# Patient Record
Sex: Male | Born: 1935
Health system: Southern US, Community
[De-identification: ages and names within clinical notes are randomized; demographics above are authoritative.]

## PROBLEM LIST (undated history)

## (undated) DIAGNOSIS — E785 Hyperlipidemia, unspecified: Secondary | ICD-10-CM

## (undated) DIAGNOSIS — C801 Malignant (primary) neoplasm, unspecified: Secondary | ICD-10-CM

## (undated) HISTORY — PX: SUPRAPUBIC CATHETER PLACEMENT: SHX2473

## (undated) HISTORY — PX: OTHER SURGICAL HISTORY: SHX169

## (undated) HISTORY — PX: CORONARY STENT PLACEMENT: SHX1402

## (undated) HISTORY — PX: CIRCUMCISION: SUR203

## (undated) HISTORY — PX: PACEMAKER IMPLANT: EP1218

---

## 2013-02-25 HISTORY — PX: COLOSTOMY: SHX63

## 2014-05-11 ENCOUNTER — Emergency Department (HOSPITAL_BASED_OUTPATIENT_CLINIC_OR_DEPARTMENT_OTHER)
Admission: EM | Admit: 2014-05-11 | Discharge: 2014-05-11 | Disposition: A | Discharge status: Home / Self Care | Payer: Medicare Other | Attending: Emergency Medicine | Admitting: Emergency Medicine

## 2014-05-11 ENCOUNTER — Encounter (HOSPITAL_BASED_OUTPATIENT_CLINIC_OR_DEPARTMENT_OTHER): Payer: Self-pay | Admitting: *Deleted

## 2014-05-11 ENCOUNTER — Emergency Department (HOSPITAL_BASED_OUTPATIENT_CLINIC_OR_DEPARTMENT_OTHER): Payer: Medicare Other

## 2014-05-11 DIAGNOSIS — Z906 Acquired absence of other parts of urinary tract: Secondary | ICD-10-CM | POA: Insufficient documentation

## 2014-05-11 DIAGNOSIS — J069 Acute upper respiratory infection, unspecified: Secondary | ICD-10-CM | POA: Insufficient documentation

## 2014-05-11 DIAGNOSIS — Z8739 Personal history of other diseases of the musculoskeletal system and connective tissue: Secondary | ICD-10-CM | POA: Insufficient documentation

## 2014-05-11 DIAGNOSIS — R05 Cough: Secondary | ICD-10-CM | POA: Diagnosis present

## 2014-05-11 DIAGNOSIS — Z8551 Personal history of malignant neoplasm of bladder: Secondary | ICD-10-CM | POA: Diagnosis not present

## 2014-05-11 HISTORY — DX: Malignant (primary) neoplasm, unspecified: C80.1

## 2014-05-11 HISTORY — DX: Hyperlipidemia, unspecified: E78.5

## 2014-05-11 MED ORDER — BENZONATATE 100 MG PO CAPS: 100.0000 mg | ORAL_CAPSULE | Freq: Three times a day (TID) | ORAL | Status: DC | PRN

## 2014-05-11 MED ORDER — FLUTICASONE PROPIONATE 50 MCG/ACT NA SUSP: 1.0000 | Freq: Every day | NASAL | Status: DC

## 2014-05-11 NOTE — Discharge Instructions (Signed)
Upper Respiratory Infection, Adult An upper respiratory infection (URI) is also sometimes known as the common cold. The upper respiratory tract includes the nose, sinuses, throat, trachea, and bronchi. Bronchi are the airways leading to the lungs. Most people improve within 1 week, but symptoms can last up to 2 weeks. A residual cough may last even longer.  CAUSES Many different viruses can infect the tissues lining the upper respiratory tract. The tissues become irritated and inflamed and often become very moist. Mucus production is also common. A cold is contagious. You can easily spread the virus to others by oral contact. This includes kissing, sharing a glass, coughing, or sneezing. Touching your mouth or nose and then touching a surface, which is then touched by another person, can also spread the virus. SYMPTOMS  Symptoms typically develop 1 to 3 days after you come in contact with a cold virus. Symptoms vary from person to person. They may include:  Runny nose.  Sneezing.  Nasal congestion.  Sinus irritation.  Sore throat.  Loss of voice (laryngitis).  Cough.  Fatigue.  Muscle aches.  Loss of appetite.  Headache.  Low-grade fever. DIAGNOSIS  You might diagnose your own cold based on familiar symptoms, since most people get a cold 2 to 3 times a year. Your caregiver can confirm this based on your exam. Most importantly, your caregiver can check that your symptoms are not due to another disease such as strep throat, sinusitis, pneumonia, asthma, or epiglottitis. Blood tests, throat tests, and X-rays are not necessary to diagnose a common cold, but they may sometimes be helpful in excluding other more serious diseases. Your caregiver will decide if any further tests are required. RISKS AND COMPLICATIONS  You may be at risk for a more severe case of the common cold if you smoke cigarettes, have chronic heart disease (such as heart failure) or lung disease (such as asthma), or if  you have a weakened immune system. The very young and very old are also at risk for more serious infections. Bacterial sinusitis, middle ear infections, and bacterial pneumonia can complicate the common cold. The common cold can worsen asthma and chronic obstructive pulmonary disease (COPD). Sometimes, these complications can require emergency medical care and may be life-threatening. PREVENTION  The best way to protect against getting a cold is to practice good hygiene. Avoid oral or hand contact with people with cold symptoms. Wash your hands often if contact occurs. There is no clear evidence that vitamin C, vitamin E, echinacea, or exercise reduces the chance of developing a cold. However, it is always recommended to get plenty of rest and practice good nutrition. TREATMENT  Treatment is directed at relieving symptoms. There is no cure. Antibiotics are not effective, because the infection is caused by a virus, not by bacteria. Treatment may include:  Increased fluid intake. Sports drinks offer valuable electrolytes, sugars, and fluids.  Breathing heated mist or steam (vaporizer or shower).  Eating chicken soup or other clear broths, and maintaining good nutrition.  Getting plenty of rest.  Using gargles or lozenges for comfort.  Controlling fevers with ibuprofen or acetaminophen as directed by your caregiver.  Increasing usage of your inhaler if you have asthma. Zinc gel and zinc lozenges, taken in the first 24 hours of the common cold, can shorten the duration and lessen the severity of symptoms. Pain medicines may help with fever, muscle aches, and throat pain. A variety of non-prescription medicines are available to treat congestion and runny nose. Your caregiver   can make recommendations and may suggest nasal or lung inhalers for other symptoms.  HOME CARE INSTRUCTIONS   Only take over-the-counter or prescription medicines for pain, discomfort, or fever as directed by your  caregiver.  Use a warm mist humidifier or inhale steam from a shower to increase air moisture. This may keep secretions moist and make it easier to breathe.  Drink enough water and fluids to keep your urine clear or pale yellow.  Rest as needed.  Return to work when your temperature has returned to normal or as your caregiver advises. You may need to stay home longer to avoid infecting others. You can also use a face mask and careful hand washing to prevent spread of the virus. SEEK MEDICAL CARE IF:   After the first few days, you feel you are getting worse rather than better.  You need your caregiver's advice about medicines to control symptoms.  You develop chills, worsening shortness of breath, or brown or red sputum. These may be signs of pneumonia.  You develop yellow or brown nasal discharge or pain in the face, especially when you bend forward. These may be signs of sinusitis.  You develop a fever, swollen neck glands, pain with swallowing, or white areas in the back of your throat. These may be signs of strep throat. SEEK IMMEDIATE MEDICAL CARE IF:   You have a fever.  You develop severe or persistent headache, ear pain, sinus pain, or chest pain.  You develop wheezing, a prolonged cough, cough up blood, or have a change in your usual mucus (if you have chronic lung disease).  You develop sore muscles or a stiff neck. Document Released: 08/07/2000 Document Revised: 05/06/2011 Document Reviewed: 05/19/2013 [employer] Patient Information 2015 [employer]. This information is not intended to replace advice given to you by your health care provider. Make sure you discuss any questions you have with your health care provider.  

## 2014-05-11 NOTE — ED Provider Notes (Signed)
TIME SEEN: 11:45 AM  CHIEF COMPLAINT: Cough, nasal congestion  HPI: Pt is a 80 y.o. male with history of hyperlipidemia, prior bladder cancer with cystectomy and current urostomy who presents emergency department with complaints of cough with white sputum production and nasal congestion for the past week. No fevers. No chest pain or shortness of breath. No nausea, vomiting or diarrhea. No sick contacts or recent travel.  ROS: See HPI Constitutional: no fever  Eyes: no drainage  ENT:  runny nose   Cardiovascular:  no chest pain  Resp: no SOB  GI: no vomiting GU: no dysuria Integumentary: no rash  Allergy: no hives  Musculoskeletal: no leg swelling  Neurological: no slurred speech ROS otherwise negative  PAST MEDICAL HISTORY/PAST SURGICAL HISTORY:  Past Medical History  Diagnosis Date  . Hyperlipidemia   . Cancer     MEDICATIONS:  Prior to Admission medications   Not on File    ALLERGIES:  Allergies  Allergen Reactions  . Asa [Aspirin] Other (See Comments)    Upset stomach  . Codeine Rash    SOCIAL HISTORY:  History  Substance Use Topics  . Smoking status: Never Smoker   . Smokeless tobacco: Not on file  . Alcohol Use: No    FAMILY HISTORY: No family history on file.  EXAM: BP 145/70 mmHg  Pulse 64  Temp(Src) 98.3 F (36.8 C) (Oral)  Resp 20  Ht 6' (1.829 m)  Wt 264 lb (119.75 kg)  BMI 35.80 kg/m2  SpO2 96% CONSTITUTIONAL: Alert and oriented and responds appropriately to questions. Well-appearing; well-nourished very well-appearing, nontoxic, pleasant, smiling HEAD: Normocephalic EYES: Conjunctivae clear, PERRL ENT: normal nose; no rhinorrhea; moist mucous membranes; pharynx without lesions noted NECK: Supple, no meningismus, no LAD  CARD: RRR; S1 and S2 appreciated; no murmurs, no clicks, no rubs, no gallops RESP: Normal chest excursion without splinting or tachypnea; breath sounds clear and equal bilaterally; no wheezes, no rhonchi, no rales, good  aeration diffusely, no hypoxia, no respiratory distress, speaking full sentences ABD/GI: Normal bowel sounds; non-distended; soft, non-tender, no rebound, no guarding BACK:  The back appears normal and is non-tender to palpation, there is no CVA tenderness EXT: Normal ROM in all joints; non-tender to palpation; no edema; normal capillary refill; no cyanosis; no calf tenderness or swelling    SKIN: Normal color for age and race; warm NEURO: Moves all extremities equally PSYCH: The patient's mood and manner are appropriate. Grooming and personal hygiene are appropriate.  MEDICAL DECISION MAKING: Patient here with cough and nasal congestion. Likely viral illness versus allergies. We'll discharge with Tessalon Perles and Flonase. Chest x-ray clear and shows no infiltrate, edema or effusion. He is hemodynamically stable with no hypoxia. Lungs are clear to auscultation with good aeration. Denies chest pain or shortness of breath. He is nontoxic appearing. I do not feel he needs to be discharged on antibiotics. Discussed return precautions. He verbalizes understanding and is comfortable with plan.       [place], DO 05/11/14 1310

## 2014-05-11 NOTE — ED Notes (Signed)
Pt seems much improved, states MD told him its probably  just allergy issues.  He states he's ready to go.

## 2014-05-11 NOTE — ED Notes (Signed)
Pt not sure he had the xray done yet.  He doesn't seem to remember. Reassured  And reoriented pt, asked if he had family waiting and he states he drove himself

## 2014-05-11 NOTE — ED Notes (Signed)
Pt reports understanding of D/c instructions, states will take medication as ordered

## 2014-05-11 NOTE — ED Notes (Signed)
Pt c/o cough and congestion x1 week. Pt reports productive cough of white mucous. Pt denies fever.

## 2014-10-04 ENCOUNTER — Emergency Department (HOSPITAL_BASED_OUTPATIENT_CLINIC_OR_DEPARTMENT_OTHER)
Admission: EM | Admit: 2014-10-04 | Discharge: 2014-10-04 | Disposition: A | Discharge status: Home / Self Care | Payer: Medicare Other | Attending: Physician Assistant | Admitting: Physician Assistant

## 2014-10-04 ENCOUNTER — Emergency Department (HOSPITAL_BASED_OUTPATIENT_CLINIC_OR_DEPARTMENT_OTHER): Payer: Medicare Other

## 2014-10-04 ENCOUNTER — Encounter (HOSPITAL_BASED_OUTPATIENT_CLINIC_OR_DEPARTMENT_OTHER): Payer: Self-pay | Admitting: *Deleted

## 2014-10-04 DIAGNOSIS — Z8551 Personal history of malignant neoplasm of bladder: Secondary | ICD-10-CM | POA: Diagnosis not present

## 2014-10-04 DIAGNOSIS — R109 Unspecified abdominal pain: Secondary | ICD-10-CM | POA: Diagnosis present

## 2014-10-04 DIAGNOSIS — Z8639 Personal history of other endocrine, nutritional and metabolic disease: Secondary | ICD-10-CM | POA: Diagnosis not present

## 2014-10-04 DIAGNOSIS — R531 Weakness: Secondary | ICD-10-CM | POA: Diagnosis not present

## 2014-10-04 LAB — URINALYSIS, ROUTINE W REFLEX MICROSCOPIC
BILIRUBIN URINE: NEGATIVE
GLUCOSE, UA: NEGATIVE mg/dL
Ketones, ur: NEGATIVE mg/dL
Nitrite: POSITIVE — AB
Specific Gravity, Urine: 1.015 (ref 1.005–1.030)
UROBILINOGEN UA: 0.2 mg/dL (ref 0.0–1.0)
pH: 7 (ref 5.0–8.0)

## 2014-10-04 LAB — URINE MICROSCOPIC-ADD ON

## 2014-10-04 LAB — CBC WITH DIFFERENTIAL/PLATELET
BASOS ABS: 0 10*3/uL (ref 0.0–0.1)
Basophils Relative: 0 % (ref 0–1)
Eosinophils Absolute: 0.1 10*3/uL (ref 0.0–0.7)
Eosinophils Relative: 1 % (ref 0–5)
HCT: 40.9 % (ref 39.0–52.0)
Hemoglobin: 13.1 g/dL (ref 13.0–17.0)
LYMPHS PCT: 10 % — AB (ref 12–46)
Lymphs Abs: 0.7 10*3/uL (ref 0.7–4.0)
MCH: 31.1 pg (ref 26.0–34.0)
MCHC: 32 g/dL (ref 30.0–36.0)
MCV: 97.1 fL (ref 78.0–100.0)
MONOS PCT: 8 % (ref 3–12)
Monocytes Absolute: 0.6 10*3/uL (ref 0.1–1.0)
Neutro Abs: 5.7 10*3/uL (ref 1.7–7.7)
Neutrophils Relative %: 81 % — ABNORMAL HIGH (ref 43–77)
Platelets: 149 10*3/uL — ABNORMAL LOW (ref 150–400)
RBC: 4.21 MIL/uL — ABNORMAL LOW (ref 4.22–5.81)
RDW: 12.5 % (ref 11.5–15.5)
WBC: 7 10*3/uL (ref 4.0–10.5)

## 2014-10-04 LAB — COMPREHENSIVE METABOLIC PANEL
ALT: 11 U/L — ABNORMAL LOW (ref 17–63)
AST: 29 U/L (ref 15–41)
Albumin: 4.3 g/dL (ref 3.5–5.0)
Alkaline Phosphatase: 66 U/L (ref 38–126)
Anion gap: 13 (ref 5–15)
BILIRUBIN TOTAL: 0.8 mg/dL (ref 0.3–1.2)
BUN: 23 mg/dL — ABNORMAL HIGH (ref 6–20)
CHLORIDE: 104 mmol/L (ref 101–111)
CO2: 24 mmol/L (ref 22–32)
Calcium: 8.9 mg/dL (ref 8.9–10.3)
Creatinine, Ser: 1.49 mg/dL — ABNORMAL HIGH (ref 0.61–1.24)
GFR, EST AFRICAN AMERICAN: 50 mL/min — AB (ref >60)
GFR, EST NON AFRICAN AMERICAN: 43 mL/min — AB (ref >60)
Glucose, Bld: 112 mg/dL — ABNORMAL HIGH (ref 65–99)
Potassium: 4.4 mmol/L (ref 3.5–5.1)
Sodium: 141 mmol/L (ref 135–145)
TOTAL PROTEIN: 7.8 g/dL (ref 6.5–8.1)

## 2014-10-04 LAB — TROPONIN I: Troponin I: <0.03 ng/mL (ref <0.031)

## 2014-10-04 LAB — LIPASE, BLOOD: Lipase: 13 U/L — ABNORMAL LOW (ref 22–51)

## 2014-10-04 MED ORDER — CEPHALEXIN 250 MG PO CAPS
500.0000 mg | ORAL_CAPSULE | Freq: Once | ORAL | Status: AC
  Administered 2014-10-04: 500 mg via ORAL
  Filled 2014-10-04: qty 2

## 2014-10-04 NOTE — ED Notes (Signed)
MD at bedside to discuss results of testing. 

## 2014-10-04 NOTE — ED Notes (Signed)
States he had country style steak yesterday and has had across the middle abd pain. No n/v/d. Feels like he has no energy. Last NL BM today.

## 2014-10-04 NOTE — ED Notes (Signed)
Urostomy emptied per pt request.

## 2014-10-04 NOTE — Discharge Instructions (Signed)
We talked to you are primary care provider they can see you tomorrow or the next day. Please call tomorrow morning.We cannot figure out why it is that you feel "unwell" but her are glad that you arer feeling better after taking a little rest here. Fatigue Fatigue is a feeling of tiredness, lack of energy, lack of motivation, or feeling tired all the time. Having enough rest, good nutrition, and reducing stress will normally reduce fatigue. Consult your caregiver if it persists. The nature of your fatigue will help your caregiver to find out its cause. The treatment is based on the cause.  CAUSES  There are many causes for fatigue. Most of the time, fatigue can be traced to one or more of your habits or routines. Most causes fit into one or more of three general areas. They are: Lifestyle problems  Sleep disturbances.  Overwork.  Physical exertion.  Unhealthy habits.  Poor eating habits or eating disorders.  Alcohol and/or drug use .  Lack of proper nutrition (malnutrition). Psychological problems  Stress and/or anxiety problems.  Depression.  Grief.  Boredom. Medical Problems or Conditions  Anemia.  Pregnancy.  Thyroid gland problems.  Recovery from major surgery.  Continuous pain.  Emphysema or asthma that is not well controlled  Allergic conditions.  Diabetes.  Infections (such as mononucleosis).  Obesity.  Sleep disorders, such as sleep apnea.  Heart failure or other heart-related problems.  Cancer.  Kidney disease.  Liver disease.  Effects of certain medicines such as antihistamines, cough and cold remedies, prescription pain medicines, heart and blood pressure medicines, drugs used for treatment of cancer, and some antidepressants. SYMPTOMS  The symptoms of fatigue include:   Lack of energy.  Lack of drive (motivation).  Drowsiness.  Feeling of indifference to the surroundings. DIAGNOSIS  The details of how you feel help guide your  caregiver in finding out what is causing the fatigue. You will be asked about your present and past health condition. It is important to review all medicines that you take, including prescription and non-prescription items. A thorough exam will be done. You will be questioned about your feelings, habits, and normal lifestyle. Your caregiver may suggest blood tests, urine tests, or other tests to look for common medical causes of fatigue.  TREATMENT  Fatigue is treated by correcting the underlying cause. For example, if you have continuous pain or depression, treating these causes will improve how you feel. Similarly, adjusting the dose of certain medicines will help in reducing fatigue.  HOME CARE INSTRUCTIONS   Try to get the required amount of good sleep every night.  Eat a healthy and nutritious diet, and drink enough water throughout the day.  Practice ways of relaxing (including yoga or meditation).  Exercise regularly.  Make plans to change situations that cause stress. Act on those plans so that stresses decrease over time. Keep your work and personal routine reasonable.  Avoid street drugs and minimize use of alcohol.  Start taking a daily multivitamin after consulting your caregiver. SEEK MEDICAL CARE IF:   You have persistent tiredness, which cannot be accounted for.  You have fever.  You have unintentional weight loss.  You have headaches.  You have disturbed sleep throughout the night.  You are feeling sad.  You have constipation.  You have dry skin.  You have gained weight.  You are taking any new or different medicines that you suspect are causing fatigue.  You are unable to sleep at night.  You develop any  unusual swelling of your legs or other parts of your body. SEEK IMMEDIATE MEDICAL CARE IF:   You are feeling confused.  Your vision is blurred.  You feel faint or pass out.  You develop severe headache.  You develop severe abdominal, pelvic, or  back pain.  You develop chest pain, shortness of breath, or an irregular or fast heartbeat.  You are unable to pass a normal amount of urine.  You develop abnormal bleeding such as bleeding from the rectum or you vomit blood.  You have thoughts about harming yourself or committing suicide.  You are worried that you might harm someone else. MAKE SURE YOU:   Understand these instructions.  Will watch your condition.  Will get help right away if you are not doing well or get worse. Document Released: 12/09/2006 Document Revised: 05/06/2011 Document Reviewed: 06/15/2013 [facility] Patient Information 2015 [place], [place]. This information is not intended to replace advice given to you by your health care provider. Make sure you discuss any questions you have with your health care provider.

## 2014-10-04 NOTE — ED Provider Notes (Addendum)
CSN: [id number]     Arrival date & time 10/04/14  27 History   First MD Initiated Contact with Patient 10/04/14 1107     No chief complaint on file.    (Consider location/radiation/quality/duration/timing/severity/associated sxs/prior Treatment) HPI   Patient is a 80 year old male with history of bladder cancer. Presenting today with vague symptoms of not feeling well. Patient reports that he ate lunch yesterday and had a chicken fried steak. After that he had mild abdominal pain. However no nausea no vomiting no diarrhea. He had a normal bowel movement this morning. Patient reports he took some Alka-Seltzer and Pepto-Bismol and his belly pain resolved. However he still just "doesn't feel right". No chest pain no real abdominal pain right now. No fevers  Past Medical History  Diagnosis Date  . Hyperlipidemia   . Cancer    Past Surgical History  Procedure Laterality Date  . Circumcision    . Bladder removed    . Suprapubic catheter placement     No family history on file. History  Substance Use Topics  . Smoking status: Never Smoker   . Smokeless tobacco: Not on file  . Alcohol Use: No    Review of Systems  Constitutional: Negative for fever and activity change.  HENT: Negative for drooling and hearing loss.   Eyes: Negative for discharge and redness.  Respiratory: Negative for cough and shortness of breath.   Cardiovascular: Negative for chest pain.  Gastrointestinal: Positive for abdominal pain.  Genitourinary: Negative for dysuria and urgency.  Musculoskeletal: Negative for arthralgias.  Allergic/Immunologic: Negative for immunocompromised state.  Neurological: Negative for seizures and speech difficulty.  Psychiatric/Behavioral: Negative for behavioral problems and agitation.  All other systems reviewed and are negative.     Allergies  Asa and Codeine  Home Medications   Prior to Admission medications   Not on File   BP 163/68 mmHg  Pulse 74  Temp(Src)  99.6 F (37.6 C) (Oral)  Resp 18  Ht 6' (1.829 m)  Wt 265 lb (120.203 kg)  BMI 35.93 kg/m2  SpO2 97% Physical Exam  Constitutional: He is oriented to person, place, and time. He appears well-nourished.  HENT:  Head: Normocephalic.  Mouth/Throat: Oropharynx is clear and moist.  Eyes: Conjunctivae are normal.  Neck: No tracheal deviation present.  Cardiovascular: Normal rate.   Pulmonary/Chest: Effort normal. No stridor. No respiratory distress.  Abdominal: Soft. There is no tenderness. There is no guarding.  absolutely no tenderness to exam.  Musculoskeletal: Normal range of motion. He exhibits no edema.  Neurological: He is oriented to person, place, and time. No cranial nerve deficit.  Skin: Skin is warm and dry. No rash noted. He is not diaphoretic.  Psychiatric: He has a normal mood and affect. His behavior is normal.  Nursing note and vitals reviewed.   ED Course  Procedures (including critical care time) Labs Review Labs Reviewed  LIPASE, BLOOD  TROPONIN I    Imaging Review No results found.   EKG Interpretation   Date/Time:  Tuesday October 04 2014 11:16:27 EDT Ventricular Rate:  63 PR Interval:  326 QRS Duration: 78 QT Interval:  402 QTC Calculation: 411 R Axis:   34 Text Interpretation:  Sinus rhythm with 1st degree A-V block Septal  infarct , age undetermined ST \\T \ T wave abnormality, consider  inferolateral ischemia Abnormal ECG T wave inversions inferior lateral.   Repeat tracings suggested Confirmed by Thomasene Lot, COURTNEY ([id number]) on  10/04/2014 11:27:34 AM      MDM  Final diagnoses:  None    Paitent is a 80 year old male with past ill history significant for for bladder cancer. He is presenting today with vague symptoms of "not feeling well".initially chief complaint was abdominal pain. However she states never really had abdominal pain. We will do screening labs and EKG and chest x-ray.   1:08 PM Patient had normal labs except for elevated  creatinine of 1.5. Given patient's vague nature, decided to call PCP. PCP reports that patient often comes in with vague symptoms. His T-wave inversions in the lateral leads are old. His creatinine is normal at 1.5 .  In other words all these labs and  EKG are at baseline.  Will PO challenge patient. With normal vital signs physical exam and labs no need for further observation. Hopefully be able to discharge   3:07 PM UA looks dirty, from ostomy. Talked to PCP, will hold off treatment at this time. Awaiting 3 hr trop and then plan to discharge.   Courteney Julio Alm, MD 10/04/14 Coffey, MD 10/04/14 [place], MD 10/04/14 1507

## 2014-10-07 LAB — URINE CULTURE: Culture: >=100000

## 2014-10-08 NOTE — Progress Notes (Signed)
ED Antimicrobial Stewardship Positive Culture Follow Up   Shawn Weaver is an 80 y.o. male who presented to [facility] on 10/04/2014 with a chief complaint of  Chief Complaint  Patient presents with  . Abdominal Pain    Recent Results (from the past 720 hour(s))  Urine culture     Status: None   Collection Time: 10/04/14  1:00 PM  Result Value Ref Range Status   Specimen Description URINE, RANDOM  Final   Special Requests NONE  Final   Culture   Final    >=100,000 COLONIES/mL KLEBSIELLA PNEUMONIAE >=100,000 COLONIES/mL KLEBSIELLA OZAENAE Performed at [facility]    Report Status 10/07/2014 FINAL  Final   Organism ID, Bacteria KLEBSIELLA PNEUMONIAE  Final   Organism ID, Bacteria KLEBSIELLA OZAENAE  Final      Susceptibility   Klebsiella pneumoniae - MIC*    AMPICILLIN 16 RESISTANT Resistant     CEFAZOLIN <=4 SENSITIVE Sensitive     CEFTRIAXONE <=1 SENSITIVE Sensitive     CIPROFLOXACIN <=0.25 SENSITIVE Sensitive     GENTAMICIN <=1 SENSITIVE Sensitive     IMIPENEM <=0.25 SENSITIVE Sensitive     NITROFURANTOIN 64 INTERMEDIATE Intermediate     TRIMETH/SULFA <=20 SENSITIVE Sensitive     AMPICILLIN/SULBACTAM 4 SENSITIVE Sensitive     PIP/TAZO <=4 SENSITIVE Sensitive     * >=100,000 COLONIES/mL KLEBSIELLA PNEUMONIAE   Klebsiella ozaenae - MIC*    AMPICILLIN >=32 RESISTANT Resistant     CEFAZOLIN <=4 SENSITIVE Sensitive     CEFTRIAXONE <=1 SENSITIVE Sensitive     CIPROFLOXACIN <=0.25 SENSITIVE Sensitive     GENTAMICIN <=1 SENSITIVE Sensitive     IMIPENEM <=0.25 SENSITIVE Sensitive     NITROFURANTOIN 64 INTERMEDIATE Intermediate     TRIMETH/SULFA <=20 SENSITIVE Sensitive     AMPICILLIN/SULBACTAM 4 SENSITIVE Sensitive     PIP/TAZO <=4 SENSITIVE Sensitive     * >=100,000 COLONIES/mL KLEBSIELLA OZAENAE    80 yo who presented with abd pain. Hx bladder CA. Pt has urostomy. UC grew out kleb. No urinary symptoms so will not treat.   ED Provider: Eugene Gavia, PA  Onnie Boer,  PharmD Pager: [phone] Infectious Diseases Pharmacist Phone# [phone]

## 2014-10-09 ENCOUNTER — Telehealth (HOSPITAL_COMMUNITY): Payer: Self-pay

## 2014-10-09 NOTE — Telephone Encounter (Signed)
Post ED Visit - Positive Culture Follow-up  Culture report reviewed by antimicrobial stewardship pharmacist: []  Wes Dulaney, Pharm.D., BCPS []  Heide Guile, Pharm.D., BCPS []  Alycia Rossetti, Pharm.D., BCPS [x]  McConnell, Pharm.D., BCPS, AAHIVP []  Legrand Como, Pharm.D., BCPS, AAHIVP []  Isac Sarna, Pharm.D., BCPS  Positive Urine culture, >/= 100,0000 colonies -> Klebsiella Ozaenae & Klebsiella Pneumoniae Chart reviewed by H. Patel-Mills PA "No treatment"  Dortha Kern 10/09/2014, 4:52 AM

## 2015-05-17 ENCOUNTER — Encounter (HOSPITAL_BASED_OUTPATIENT_CLINIC_OR_DEPARTMENT_OTHER): Payer: Self-pay | Admitting: Emergency Medicine

## 2015-05-17 ENCOUNTER — Emergency Department (HOSPITAL_BASED_OUTPATIENT_CLINIC_OR_DEPARTMENT_OTHER)
Admission: EM | Admit: 2015-05-17 | Discharge: 2015-05-18 | Disposition: A | Discharge status: Home / Self Care | Payer: Medicare Other | Attending: Emergency Medicine | Admitting: Emergency Medicine

## 2015-05-17 ENCOUNTER — Emergency Department (HOSPITAL_BASED_OUTPATIENT_CLINIC_OR_DEPARTMENT_OTHER): Payer: Medicare Other

## 2015-05-17 DIAGNOSIS — G4489 Other headache syndrome: Secondary | ICD-10-CM | POA: Diagnosis not present

## 2015-05-17 DIAGNOSIS — Z859 Personal history of malignant neoplasm, unspecified: Secondary | ICD-10-CM | POA: Diagnosis not present

## 2015-05-17 DIAGNOSIS — R51 Headache: Secondary | ICD-10-CM | POA: Diagnosis present

## 2015-05-17 DIAGNOSIS — Z8669 Personal history of other diseases of the nervous system and sense organs: Secondary | ICD-10-CM | POA: Insufficient documentation

## 2015-05-17 LAB — RAPID STREP SCREEN (MED CTR MEBANE ONLY): Streptococcus, Group A Screen (Direct): NEGATIVE

## 2015-05-17 MED ORDER — ACETAMINOPHEN 500 MG PO TABS
1000.0000 mg | ORAL_TABLET | Freq: Once | ORAL | Status: AC
  Administered 2015-05-17: 1000 mg via ORAL
  Filled 2015-05-17: qty 2

## 2015-05-17 MED ORDER — METOCLOPRAMIDE HCL 10 MG PO TABS
10.0000 mg | ORAL_TABLET | Freq: Three times a day (TID) | ORAL | Status: DC
  Administered 2015-05-17: 10 mg via ORAL
  Filled 2015-05-17: qty 1

## 2015-05-17 NOTE — ED Notes (Signed)
Patient transported to CT 

## 2015-05-17 NOTE — ED Provider Notes (Addendum)
CSN: [id number]     Arrival date & time 05/17/15  1920 History  By signing my name below, I, Stephania Fragmin, attest that this documentation has been prepared under the direction and in the presence of Cate Oravec, MD. Electronically Signed: Stephania Fragmin, ED Scribe. 05/17/2015. 11:50 PM.   Chief Complaint  Patient presents with  . Headache   Patient is a 81 y.o. male presenting with headaches. The history is provided by the patient. No language interpreter was used.  Headache Pain location:  Frontal Quality:  Unable to specify (aching) Radiates to:  Does not radiate Severity currently:  5/10 Onset quality:  Gradual Duration: "several days" Timing: waxing and waning but constant. Progression:  Improving Chronicity:  New Context: not activity   Relieved by:  Acetaminophen Worsened by:  Nothing Ineffective treatments:  None tried Associated symptoms: no back pain, no blurred vision, no congestion, no cough, no diarrhea, no dizziness, no fever, no focal weakness, no loss of balance, no neck pain, no neck stiffness, no numbness, no photophobia, no syncope, no vomiting and no weakness   Risk factors: no anger    HPI Comments: Shawn Weaver is a 81 y.o. male with a history of hyperlipidemia and cancer, who presents to the Emergency Department complaining of a gradual-onset, intermittent, improving generalized frontal headache that began several days ago. He states it has improved since arriving to the ED. Patient states he has taken Tylenol, last taken 1.5 days ago, with mild relief. He denies nasal congestion, cough, rhinorrhea, emesis, diarrhea, or dysuria.   PCP: [name], MD   Past Medical History  Diagnosis Date  . Hyperlipidemia   . Cancer [facility])    Past Surgical History  Procedure Laterality Date  . Circumcision    . Bladder removed    . Suprapubic catheter placement    . Colostomy  2015   No family history on file. Social History  Substance Use Topics  . Smoking status:  Never Smoker   . Smokeless tobacco: None  . Alcohol Use: No    Review of Systems  Constitutional: Negative for fever.  HENT: Negative for congestion and rhinorrhea.   Eyes: Negative for blurred vision and photophobia.  Respiratory: Negative for cough.   Cardiovascular: Negative for syncope.  Gastrointestinal: Negative for vomiting and diarrhea.  Genitourinary: Negative for dysuria.  Musculoskeletal: Negative for back pain, neck pain and neck stiffness.  Neurological: Positive for headaches. Negative for dizziness, focal weakness, facial asymmetry, speech difficulty, weakness, numbness and loss of balance.  All other systems reviewed and are negative.  Allergies  Asa and Codeine  Home Medications   Prior to Admission medications   Not on File   BP 107/89 mmHg  Pulse 95  Temp(Src) 100.2 F (37.9 C) (Oral)  Resp 18  Ht 6' (1.829 m)  Wt 268 lb (121.564 kg)  BMI 36.34 kg/m2  SpO2 100% Physical Exam  Constitutional: He is oriented to person, place, and time. He appears well-developed and well-nourished. No distress.  Well-appearing.  HENT:  Head: Normocephalic and atraumatic.  Mouth/Throat: Oropharynx is clear and moist. No oropharyngeal exudate.  Eyes: Conjunctivae and EOM are normal. Pupils are equal, round, and reactive to light.  Neck: Normal range of motion. Neck supple. No tracheal deviation present. No Brudzinski's sign and no Kernig's sign noted.  No signs of meningismus.   Cardiovascular: Normal rate, regular rhythm, normal heart sounds and intact distal pulses.   Pulmonary/Chest: Effort normal and breath sounds normal. No respiratory distress. He  has no wheezes. He has no rales.  Lungs are clear to auscultation.   Abdominal: Soft. Bowel sounds are increased. There is no tenderness. There is no rebound and no guarding.  Hyperactive bowel sounds.  Musculoskeletal: Normal range of motion.  Lymphadenopathy:    He has no cervical adenopathy.  Neurological: He is  alert and oriented to person, place, and time. He has normal reflexes. No cranial nerve deficit.  Cranial nerves II-XII are intact. Normal upper and lower DTR's.   Skin: Skin is warm and dry.  Psychiatric: He has a normal mood and affect. His behavior is normal.  Nursing note and vitals reviewed.   ED Course  Procedures (including critical care time)  DIAGNOSTIC STUDIES: Oxygen Saturation is 100% on RA, normal by my interpretation.    COORDINATION OF CARE: 11:12 PM - Discussed treatment plan with pt at bedside. Pt verbalized understanding and agreed to plan.   Labs Review Labs Reviewed - No data to display  Imaging Review No results found. I have personally reviewed and evaluated these images and lab results as part of my medical decision-making.   EKG Interpretation None      MDM   Final diagnoses:  None   Medications  metoCLOPramide (REGLAN) tablet 10 mg (10 mg Oral Given 05/17/15 2326)  acetaminophen (TYLENOL) tablet 1,000 mg (1,000 mg Oral Given 05/17/15 2326)  naproxen (NAPROSYN) tablet 500 mg (500 mg Oral Given 05/18/15 0047)    Results for orders placed or performed during the hospital encounter of 05/17/15  Rapid strep screen  Result Value Ref Range   Streptococcus, Group A Screen (Direct) NEGATIVE NEGATIVE   Ct Head Wo Contrast  05/18/2015  CLINICAL DATA:  Bi frontal headaches for 1-1/2 days. No head injury. EXAM: CT HEAD WITHOUT CONTRAST TECHNIQUE: Contiguous axial images were obtained from the base of the skull through the vertex without intravenous contrast. COMPARISON:  None. FINDINGS: Mild diffuse cerebral atrophy. Mild ventricular dilatation consistent with central atrophy. Patchy low-attenuation changes in the deep white matter consistent with small vessel ischemia. No mass effect or midline shift. No abnormal extra-axial fluid collections. Gray-white matter junctions are distinct. Basal cisterns are not effaced. No evidence of acute intracranial hemorrhage.  No depressed skull fractures. Visualized paranasal sinuses and mastoid air cells are not opacified. Vascular calcifications. Small subcutaneous scalp hematoma over the left anterior frontal region. IMPRESSION: No acute intracranial abnormalities. Mild chronic atrophy and small vessel ischemic changes. Electronically Signed   By: Lucienne Capers M.D.   On: 05/18/2015 00:03  Well appearing, exam is benign and reassuring there are no signs of meningitis.  No indication for LP at this time.    Feeling better post medication.  Follow up with your PMD for recheck in 2 days.  Strict return precautions given for fever stiff neck or any concerns.  Patient verbalizes understanding and agrees to follow up  I personally performed the services described in this documentation, which was scribed in my presence. The recorded information has been reviewed and is accurate.       Veatrice Kells, MD 05/18/15 0131  Raylene Carmickle, MD 05/18/15 [id number]

## 2015-05-17 NOTE — ED Notes (Signed)
Pt c/o persistent headache for a few days also with upset stomach. denies LOC. Alert/Oriented at triage. Took Pepto-bismal at home.

## 2015-05-18 MED ORDER — NAPROXEN 250 MG PO TABS
500.0000 mg | ORAL_TABLET | Freq: Once | ORAL | Status: AC
  Administered 2015-05-18: 500 mg via ORAL
  Filled 2015-05-18: qty 2

## 2015-05-18 MED ORDER — IBUPROFEN 800 MG PO TABS: 800.0000 mg | ORAL_TABLET | Freq: Three times a day (TID) | ORAL | Status: DC

## 2015-05-18 NOTE — Discharge Instructions (Signed)

## 2015-05-18 NOTE — ED Notes (Signed)
Pt verbalizes understanding of d/c instructions and denies any further needs at this time. 

## 2015-05-20 LAB — CULTURE, GROUP A STREP (THRC)

## 2015-09-15 ENCOUNTER — Inpatient Hospital Stay (HOSPITAL_BASED_OUTPATIENT_CLINIC_OR_DEPARTMENT_OTHER)
Admission: EM | Admit: 2015-09-15 | Discharge: 2015-09-18 | DRG: 871 | Disposition: A | Discharge status: Home / Self Care | Payer: Medicare Other | Attending: Internal Medicine | Admitting: Internal Medicine

## 2015-09-15 ENCOUNTER — Encounter (HOSPITAL_BASED_OUTPATIENT_CLINIC_OR_DEPARTMENT_OTHER): Payer: Self-pay | Admitting: Emergency Medicine

## 2015-09-15 ENCOUNTER — Emergency Department (HOSPITAL_BASED_OUTPATIENT_CLINIC_OR_DEPARTMENT_OTHER): Payer: Medicare Other

## 2015-09-15 DIAGNOSIS — N183 Chronic kidney disease, stage 3 unspecified: Secondary | ICD-10-CM | POA: Diagnosis present

## 2015-09-15 DIAGNOSIS — R531 Weakness: Secondary | ICD-10-CM

## 2015-09-15 DIAGNOSIS — Z8551 Personal history of malignant neoplasm of bladder: Secondary | ICD-10-CM

## 2015-09-15 DIAGNOSIS — G4730 Sleep apnea, unspecified: Secondary | ICD-10-CM | POA: Diagnosis present

## 2015-09-15 DIAGNOSIS — I129 Hypertensive chronic kidney disease with stage 1 through stage 4 chronic kidney disease, or unspecified chronic kidney disease: Secondary | ICD-10-CM | POA: Diagnosis present

## 2015-09-15 DIAGNOSIS — F329 Major depressive disorder, single episode, unspecified: Secondary | ICD-10-CM | POA: Diagnosis present

## 2015-09-15 DIAGNOSIS — D649 Anemia, unspecified: Secondary | ICD-10-CM | POA: Diagnosis present

## 2015-09-15 DIAGNOSIS — B9620 Unspecified Escherichia coli [E. coli] as the cause of diseases classified elsewhere: Secondary | ICD-10-CM | POA: Diagnosis present

## 2015-09-15 DIAGNOSIS — R5383 Other fatigue: Secondary | ICD-10-CM | POA: Diagnosis present

## 2015-09-15 DIAGNOSIS — D638 Anemia in other chronic diseases classified elsewhere: Secondary | ICD-10-CM | POA: Diagnosis present

## 2015-09-15 DIAGNOSIS — N390 Urinary tract infection, site not specified: Secondary | ICD-10-CM | POA: Diagnosis present

## 2015-09-15 DIAGNOSIS — J189 Pneumonia, unspecified organism: Secondary | ICD-10-CM | POA: Diagnosis present

## 2015-09-15 DIAGNOSIS — R7989 Other specified abnormal findings of blood chemistry: Secondary | ICD-10-CM | POA: Diagnosis not present

## 2015-09-15 DIAGNOSIS — N179 Acute kidney failure, unspecified: Secondary | ICD-10-CM | POA: Diagnosis present

## 2015-09-15 DIAGNOSIS — R4182 Altered mental status, unspecified: Secondary | ICD-10-CM | POA: Diagnosis present

## 2015-09-15 DIAGNOSIS — F32A Depression, unspecified: Secondary | ICD-10-CM | POA: Diagnosis present

## 2015-09-15 DIAGNOSIS — E860 Dehydration: Secondary | ICD-10-CM | POA: Diagnosis present

## 2015-09-15 DIAGNOSIS — Z9114 Patient's other noncompliance with medication regimen: Secondary | ICD-10-CM

## 2015-09-15 DIAGNOSIS — A4151 Sepsis due to Escherichia coli [E. coli]: Principal | ICD-10-CM | POA: Diagnosis present

## 2015-09-15 DIAGNOSIS — I440 Atrioventricular block, first degree: Secondary | ICD-10-CM | POA: Diagnosis present

## 2015-09-15 DIAGNOSIS — E785 Hyperlipidemia, unspecified: Secondary | ICD-10-CM | POA: Diagnosis present

## 2015-09-15 DIAGNOSIS — M109 Gout, unspecified: Secondary | ICD-10-CM | POA: Diagnosis present

## 2015-09-15 DIAGNOSIS — Z7951 Long term (current) use of inhaled steroids: Secondary | ICD-10-CM

## 2015-09-15 DIAGNOSIS — E871 Hypo-osmolality and hyponatremia: Secondary | ICD-10-CM | POA: Diagnosis present

## 2015-09-15 LAB — CBC
HCT: 35.2 % — ABNORMAL LOW (ref 39.0–52.0)
Hemoglobin: 11.4 g/dL — ABNORMAL LOW (ref 13.0–17.0)
MCH: 30.3 pg (ref 26.0–34.0)
MCHC: 32.4 g/dL (ref 30.0–36.0)
MCV: 93.6 fL (ref 78.0–100.0)
Platelets: 210 K/uL (ref 150–400)
RBC: 3.76 MIL/uL — ABNORMAL LOW (ref 4.22–5.81)
RDW: 13.5 % (ref 11.5–15.5)
WBC: 8.3 K/uL (ref 4.0–10.5)

## 2015-09-15 LAB — COMPREHENSIVE METABOLIC PANEL WITH GFR
ALT: 12 U/L — ABNORMAL LOW (ref 17–63)
AST: 20 U/L (ref 15–41)
Albumin: 3.8 g/dL (ref 3.5–5.0)
Alkaline Phosphatase: 66 U/L (ref 38–126)
Anion gap: 11 (ref 5–15)
BUN: 38 mg/dL — ABNORMAL HIGH (ref 6–20)
CO2: 23 mmol/L (ref 22–32)
Calcium: 8.7 mg/dL — ABNORMAL LOW (ref 8.9–10.3)
Chloride: 97 mmol/L — ABNORMAL LOW (ref 101–111)
Creatinine, Ser: 2.31 mg/dL — ABNORMAL HIGH (ref 0.61–1.24)
GFR calc Af Amer: 29 mL/min — ABNORMAL LOW
GFR calc non Af Amer: 25 mL/min — ABNORMAL LOW
Glucose, Bld: 124 mg/dL — ABNORMAL HIGH (ref 65–99)
Potassium: 4.2 mmol/L (ref 3.5–5.1)
Sodium: 131 mmol/L — ABNORMAL LOW (ref 135–145)
Total Bilirubin: 1.4 mg/dL — ABNORMAL HIGH (ref 0.3–1.2)
Total Protein: 8.1 g/dL (ref 6.5–8.1)

## 2015-09-15 LAB — I-STAT CG4 LACTIC ACID, ED: LACTIC ACID, VENOUS: 1.61 mmol/L (ref 0.5–1.9)

## 2015-09-15 MED ORDER — ACETAMINOPHEN 325 MG PO TABS
650.0000 mg | ORAL_TABLET | Freq: Once | ORAL | Status: AC | PRN
  Administered 2015-09-15: 650 mg via ORAL
  Filled 2015-09-15: qty 2

## 2015-09-15 MED ORDER — AZITHROMYCIN 500 MG IV SOLR
500.0000 mg | Freq: Once | INTRAVENOUS | Status: AC
  Administered 2015-09-15: 500 mg via INTRAVENOUS

## 2015-09-15 MED ORDER — DEXTROSE 5 % IV SOLN
1.0000 g | Freq: Once | INTRAVENOUS | Status: AC
  Administered 2015-09-15: 1 g via INTRAVENOUS
  Filled 2015-09-15: qty 10

## 2015-09-15 MED ORDER — SODIUM CHLORIDE 0.9 % IV BOLUS (SEPSIS)
1000.0000 mL | Freq: Once | INTRAVENOUS | Status: AC
  Administered 2015-09-15: 1000 mL via INTRAVENOUS

## 2015-09-15 MED ORDER — AZITHROMYCIN 500 MG IV SOLR
INTRAVENOUS | Status: AC
  Filled 2015-09-15: qty 500

## 2015-09-15 NOTE — ED Notes (Signed)
Pt's wife is leaving to go home, pt's wife said to call her when the patient is being admitted and to where-  instructed  Pt's wife that we are unaware of wither the patient will be discharged or sent to another hospital. Pt's wife verbalized understanding and understood the patient would need to be picked up if discharged.  Obtained pt's wife home phone number [phone].

## 2015-09-15 NOTE — ED Notes (Addendum)
Patient states that he "just doesn't feel good"  - reports that he is having Headaches. And is not taking his medications per his wife. The patient reports that he has been dizzy, - patient seems confused to his wife.  Patients wife states that "this has been going on a long time"  Patient today though is more confused, for instance is unable to rate his pain, unable to answer questions appropriately and is dressed inappropriately ( shirt is on backwards, and wife had to fix him wearing 2 shirts)

## 2015-09-15 NOTE — Progress Notes (Signed)
Transfer from [facility] 81 y.o. male with PMHx of HLD, HTN, sleep apnea, and bladder cancer s/p suprapubic catheter; who presents to the lungs of gradual onset, constant, generalized fatigue/weakness, and cough. Temperature up to 102.56F, all other VS wnl. WBC 8.3, hbg 11.4, lactic acid 1.6, creatinine 2.31, BUN 38.  CXR shows LLL pna. UA should be collected prior to transport. Given 1 L of IV fluids, started on ceftriaxone and azithromycin for presumed CAP. Asked to check EKG prior to transport. Transferring to a MedSurg bed unless abnormal EKG.

## 2015-09-15 NOTE — ED Notes (Signed)
Lactic acid result delivered to Dr Tamera Punt.

## 2015-09-15 NOTE — ED Notes (Signed)
Patient transported to X-ray 

## 2015-09-15 NOTE — ED Provider Notes (Signed)
CSN: [id number]     Arrival date & time 09/15/15  1951 History  By signing my name below, I, Shawn Weaver, attest that this documentation has been prepared under the direction and in the presence of non-physician practitioner, Eliezer Mccoy, PA-C. Electronically Signed: [name], Scribe. 09/15/2015. 9:13 PM.   Chief Complaint  Patient presents with  . Altered Mental Status    The history is provided by the patient and the spouse. No language interpreter was used.     HPI Comments: Shawn Weaver is a 81 y.o. male with PMHx of HLD, HTN, sleep apnea, and bladder cancer who presents to the Emergency Department complaining of gradual onset, constant, generalized fatigue and weakness ongoing for several months. Pt states he has "no energy." Pt reports that he has seen his PCP for these symptoms; the last time he saw his PCP was several weeks ago. Per his wife, he has been coughing a lot lately (more often in the morning). He endorses intermittent SOB as well. His wife states that he has been sleeping more than usual over the last several months. She reports that pt occasionally becomes forgetful as well. Pt states that he uses many medications on a regular basis. Pt wears a suprapubic ostomy catheter and changes it himself. He does not use oxygen at home. He denies dysuria, abdominal pain, rashes, neuro deficits, headache, neck pain, back pain, nausea, vomiting, increased urinary frequency, chest pain, other pain, or any other associated symptoms.  Past Medical History  Diagnosis Date  . Hyperlipidemia   . Cancer [facility])    Past Surgical History  Procedure Laterality Date  . Circumcision    . Bladder removed    . Suprapubic catheter placement    . Colostomy  2015   History reviewed. No pertinent family history. Social History  Substance Use Topics  . Smoking status: Never Smoker   . Smokeless tobacco: None  . Alcohol Use: No    Review of Systems  Constitutional: Positive for activity  change (sleeping more than usual) and fatigue.  Respiratory: Positive for cough and shortness of breath (intermittent).   Cardiovascular: Negative for chest pain.  Gastrointestinal: Negative for nausea, vomiting and abdominal pain.  Genitourinary: Negative for dysuria and frequency.  Musculoskeletal: Negative for myalgias, back pain, arthralgias and neck pain.  Skin: Negative for rash.  Neurological: Negative for headaches.       Negative for sensation loss.  Psychiatric/Behavioral: The patient is not nervous/anxious.     Allergies  Asa and Codeine  Home Medications   Prior to Admission medications   Medication Sig Start Date End Date Taking? Authorizing Provider  atorvastatin (LIPITOR) 20 MG tablet Take 20 mg by mouth daily.   Yes Historical Provider, MD  citalopram (CELEXA) 20 MG tablet Take 20 mg by mouth daily.   Yes Historical Provider, MD  doxazosin (CARDURA) 2 MG tablet Take 2 mg by mouth at bedtime.   Yes Historical Provider, MD  fluticasone (FLONASE) 50 MCG/ACT nasal spray Place 1 spray into both nostrils daily.   Yes Historical Provider, MD  metoprolol succinate (TOPROL-XL) 25 MG 24 hr tablet Take 25 mg by mouth 2 (two) times daily.   Yes Historical Provider, MD  triamterene-hydrochlorothiazide (MAXZIDE-25) 37.5-25 MG tablet Take 1 tablet by mouth daily.   Yes Historical Provider, MD  cetirizine (ZYRTEC) 10 MG tablet Take 10 mg by mouth daily.    Historical Provider, MD  furosemide (LASIX) 20 MG tablet Take 20 mg by mouth.  Historical Provider, MD  ibuprofen (ADVIL,MOTRIN) 800 MG tablet Take 1 tablet (800 mg total) by mouth 3 (three) times daily. 05/18/15   April Palumbo, MD  LORazepam (ATIVAN) 0.5 MG tablet Take 0.5 mg by mouth every 8 (eight) hours as needed for anxiety.    Historical Provider, MD   BP 127/75 mmHg  Pulse 64  Temp(Src) 98.3 F (36.8 C) (Oral)  Resp 23  Ht 6' (1.829 m)  Wt 121.564 kg  BMI 36.34 kg/m2  SpO2 100% Physical Exam  Constitutional: He  appears well-developed and well-nourished. No distress.  HENT:  Head: Normocephalic and atraumatic.  Mouth/Throat: Oropharynx is clear and moist. No oropharyngeal exudate.  Eyes: Conjunctivae and EOM are normal. Pupils are equal, round, and reactive to light. Right eye exhibits no discharge. Left eye exhibits no discharge. No scleral icterus.  Neck: Normal range of motion. Neck supple. No thyromegaly present.  Cardiovascular: Normal rate, regular rhythm, normal heart sounds and intact distal pulses.  Exam reveals no gallop and no friction rub.   No murmur heard. Pulmonary/Chest: Effort normal and breath sounds normal. No stridor. No respiratory distress. He has no wheezes. He has no rales.  No significant findings on lung exam  Abdominal: Soft. Bowel sounds are normal. He exhibits no distension. There is no tenderness. There is no rebound and no guarding.  Musculoskeletal: He exhibits no edema.  Lymphadenopathy:    He has no cervical adenopathy.  Neurological: He is alert. Coordination normal.  CN 3-12 intact; normal sensation throughout; 5/5 strength in all 4 extremities; equal bilateral grip strength; no ataxia on finger to nose   Skin: Skin is warm and dry. No rash noted. He is not diaphoretic. No pallor.  Ostomy bag noted to abdomen; hole accidentally cut by patient, urine exposed to air  Psychiatric: He has a normal mood and affect.  Nursing note and vitals reviewed.   ED Course  Procedures (including critical care time)  DIAGNOSTIC STUDIES: Oxygen Saturation is 95% on RA, adequate by my interpretation.    COORDINATION OF CARE: 9:14 PM Discussed treatment plan with pt and his wife at bedside and they agreed to plan.  Labs Review Labs Reviewed  COMPREHENSIVE METABOLIC PANEL - Abnormal; Notable for the following:    Sodium 131 (*)    Chloride 97 (*)    Glucose, Bld 124 (*)    BUN 38 (*)    Creatinine, Ser 2.31 (*)    Calcium 8.7 (*)    ALT 12 (*)    Total Bilirubin 1.4  (*)    GFR calc non Af Amer 25 (*)    GFR calc Af Amer 29 (*)    All other components within normal limits  CBC - Abnormal; Notable for the following:    RBC 3.76 (*)    Hemoglobin 11.4 (*)    HCT 35.2 (*)    All other components within normal limits  CULTURE, BLOOD (ROUTINE X 2)  CULTURE, BLOOD (ROUTINE X 2)  URINE CULTURE  URINALYSIS, ROUTINE W REFLEX MICROSCOPIC (NOT AT [facility])  TSH  I-STAT CG4 LACTIC ACID, ED  I-STAT CG4 LACTIC ACID, ED    Imaging Review Dg Chest 2 View  09/15/2015  CLINICAL DATA:  Fever and weakness.  Altered mental status EXAM: CHEST  2 VIEW COMPARISON:  06/03/2015 FINDINGS: Indistinct left basilar opacity. This area was clear on abdominal CT 06/20/2015. Lung volumes are mildly low. Chronic cardiomegaly. Stable mediastinal contours. There is no edema, effusion, or pneumothorax. IMPRESSION: Left basilar  opacity concerning for pneumonia in this clinical setting. Electronically Signed   By: Monte Fantasia M.D.   On: 09/15/2015 21:01   I have personally reviewed and evaluated these images and lab results as part of my medical decision-making.   EKG Interpretation   Date/Time:  Friday September 15 2015 23:04:05 EDT Ventricular Rate:  69 PR Interval:    QRS Duration: 89 QT Interval:  421 QTC Calculation: 451 R Axis:   2 Text Interpretation:  Sinus rhythm Prolonged PR interval Anterior infarct,  old Abnormal T, consider ischemia, diffuse leads since last tracing no  significant change Confirmed by BELFI  MD, MELANIE ([id number]) on 09/15/2015  11:42:14 PM      MDM   Patient with community-acquired pneumonia. CXR shows left basilar opacity concerning for pneumonia. CBC shows hemoglobin 11.4. CMP shows sodium 131, chloride 97, glucose 124, BUN 38, creatinine 2.31 (BUN and creatinine are elevated from last collection), calcium 8.7, total bilirubin 1.4, ALT 12. Blood cultures pending. Lactic acid 1.61. We are unable to change patient's urostomy bag at this facility and  urine in current bag is contaminated due to hole as discussed below. Patient will need bag changed on arrival to [facility] for uncontaminated urine specimen. EKG shows NSR, prolonged PR interval, old anterior infarct, abnormal T in diffuse leads but no significant change since last tracing. Patient hemodynamically stable. Nurse concern for poor home care/neglect and suggests social work consult. Example: Patient cut hole in his suprapubic urostomy bag while trying to change it today and wife did not seem to be concerned or try to help. Wife also left patient here without seeming like she would return to pick him up if necessary, per nurse. Patient with fever of 102.4 on arrival, decreased with Tylenol to 98.3 in ED. Ceftriaxone and azithromycin initiated in ED. Fluids initiated at 100c/hr in ED per Dr. Thompson Caul recommendation. I spoke with Dr. Fuller Plan with internal medicine who agrees to admit the patient for further evaluation and treatment. Patient also evaluated by Dr. Tamera Punt who is in agreement with plan.  Final diagnoses:  Community acquired pneumonia    I personally performed the services described in this documentation, which was scribed in my presence. The recorded information has been reviewed and is accurate.   [address] PA-C 09/16/15 [place], MD 09/18/15 1530

## 2015-09-16 ENCOUNTER — Encounter (HOSPITAL_COMMUNITY): Payer: Self-pay | Admitting: Family Medicine

## 2015-09-16 DIAGNOSIS — F32A Depression, unspecified: Secondary | ICD-10-CM | POA: Diagnosis present

## 2015-09-16 DIAGNOSIS — N183 Chronic kidney disease, stage 3 unspecified: Secondary | ICD-10-CM | POA: Diagnosis present

## 2015-09-16 DIAGNOSIS — F329 Major depressive disorder, single episode, unspecified: Secondary | ICD-10-CM | POA: Diagnosis present

## 2015-09-16 DIAGNOSIS — R5383 Other fatigue: Secondary | ICD-10-CM | POA: Diagnosis present

## 2015-09-16 DIAGNOSIS — N179 Acute kidney failure, unspecified: Secondary | ICD-10-CM

## 2015-09-16 DIAGNOSIS — J189 Pneumonia, unspecified organism: Secondary | ICD-10-CM | POA: Insufficient documentation

## 2015-09-16 DIAGNOSIS — Z8551 Personal history of malignant neoplasm of bladder: Secondary | ICD-10-CM

## 2015-09-16 DIAGNOSIS — E871 Hypo-osmolality and hyponatremia: Secondary | ICD-10-CM | POA: Diagnosis present

## 2015-09-16 DIAGNOSIS — D649 Anemia, unspecified: Secondary | ICD-10-CM | POA: Diagnosis present

## 2015-09-16 DIAGNOSIS — R531 Weakness: Secondary | ICD-10-CM

## 2015-09-16 LAB — IRON AND TIBC
Iron: 15 ug/dL — ABNORMAL LOW (ref 45–182)
SATURATION RATIOS: 8 % — AB (ref 17.9–39.5)
TIBC: 192 ug/dL — ABNORMAL LOW (ref 250–450)
UIBC: 177 ug/dL

## 2015-09-16 LAB — URINE MICROSCOPIC-ADD ON

## 2015-09-16 LAB — BLOOD CULTURE ID PANEL (REFLEXED)
ACINETOBACTER BAUMANNII: NOT DETECTED
CANDIDA GLABRATA: NOT DETECTED
CANDIDA KRUSEI: NOT DETECTED
CANDIDA PARAPSILOSIS: NOT DETECTED
CARBAPENEM RESISTANCE: NOT DETECTED
Candida albicans: NOT DETECTED
Candida tropicalis: NOT DETECTED
ENTEROBACTERIACEAE SPECIES: DETECTED — AB
ENTEROCOCCUS SPECIES: NOT DETECTED
ESCHERICHIA COLI: DETECTED — AB
Enterobacter cloacae complex: NOT DETECTED
Haemophilus influenzae: NOT DETECTED
KLEBSIELLA OXYTOCA: NOT DETECTED
KLEBSIELLA PNEUMONIAE: NOT DETECTED
LISTERIA MONOCYTOGENES: NOT DETECTED
Methicillin resistance: NOT DETECTED
NEISSERIA MENINGITIDIS: NOT DETECTED
PROTEUS SPECIES: NOT DETECTED
Pseudomonas aeruginosa: NOT DETECTED
SERRATIA MARCESCENS: NOT DETECTED
STAPHYLOCOCCUS SPECIES: NOT DETECTED
STREPTOCOCCUS AGALACTIAE: NOT DETECTED
STREPTOCOCCUS PYOGENES: NOT DETECTED
STREPTOCOCCUS SPECIES: NOT DETECTED
Staphylococcus aureus (BCID): NOT DETECTED
Streptococcus pneumoniae: NOT DETECTED
Vancomycin resistance: NOT DETECTED

## 2015-09-16 LAB — TROPONIN I
TROPONIN I: 0.09 ng/mL — AB (ref <0.03)
TROPONIN I: 0.07 ng/mL — AB (ref <0.03)
Troponin I: 0.08 ng/mL (ref <0.03)
Troponin I: 0.07 ng/mL (ref <0.03)

## 2015-09-16 LAB — URINALYSIS, ROUTINE W REFLEX MICROSCOPIC
Bilirubin Urine: NEGATIVE
GLUCOSE, UA: NEGATIVE mg/dL
Ketones, ur: NEGATIVE mg/dL
Nitrite: NEGATIVE
PH: 6.5 (ref 5.0–8.0)
Protein, ur: NEGATIVE mg/dL
Specific Gravity, Urine: 1.01 (ref 1.005–1.030)

## 2015-09-16 LAB — VITAMIN B12: VITAMIN B 12: 410 pg/mL (ref 180–914)

## 2015-09-16 LAB — FERRITIN: Ferritin: 597 ng/mL — ABNORMAL HIGH (ref 24–336)

## 2015-09-16 LAB — T4, FREE: FREE T4: 0.82 ng/dL (ref 0.61–1.12)

## 2015-09-16 LAB — STREP PNEUMONIAE URINARY ANTIGEN: STREP PNEUMO URINARY ANTIGEN: NEGATIVE

## 2015-09-16 LAB — TSH: TSH: 1.409 u[IU]/mL (ref 0.350–4.500)

## 2015-09-16 MED ORDER — CITALOPRAM HYDROBROMIDE 20 MG PO TABS
20.0000 mg | ORAL_TABLET | Freq: Every day | ORAL | Status: DC
  Administered 2015-09-16 – 2015-09-18 (×3): 20 mg via ORAL
  Filled 2015-09-16 (×3): qty 1

## 2015-09-16 MED ORDER — LORATADINE 10 MG PO TABS
10.0000 mg | ORAL_TABLET | Freq: Every day | ORAL | Status: DC
  Administered 2015-09-16 – 2015-09-18 (×3): 10 mg via ORAL
  Filled 2015-09-16 (×3): qty 1

## 2015-09-16 MED ORDER — AZITHROMYCIN 500 MG PO TABS
500.0000 mg | ORAL_TABLET | ORAL | Status: DC
  Administered 2015-09-16 – 2015-09-17 (×2): 500 mg via ORAL
  Filled 2015-09-16 (×2): qty 1

## 2015-09-16 MED ORDER — ACETAMINOPHEN 325 MG PO TABS: 650.0000 mg | ORAL_TABLET | Freq: Once | ORAL | Status: DC

## 2015-09-16 MED ORDER — CEFTRIAXONE SODIUM 1 G IJ SOLR
1.0000 g | INTRAMUSCULAR | Status: DC
  Filled 2015-09-16: qty 10

## 2015-09-16 MED ORDER — DEXTROSE 5 % IV SOLN
2.0000 g | INTRAVENOUS | Status: DC
  Administered 2015-09-16 – 2015-09-17 (×2): 2 g via INTRAVENOUS
  Filled 2015-09-16 (×4): qty 2

## 2015-09-16 MED ORDER — SODIUM CHLORIDE 0.9 % IV SOLN
Freq: Once | INTRAVENOUS | Status: DC
Start: 2015-09-16 — End: 2015-09-16

## 2015-09-16 MED ORDER — FLUTICASONE PROPIONATE 50 MCG/ACT NA SUSP
1.0000 | Freq: Every day | NASAL | Status: DC
  Administered 2015-09-16 – 2015-09-18 (×2): 1 via NASAL
  Filled 2015-09-16: qty 16

## 2015-09-16 MED ORDER — HEPARIN SODIUM (PORCINE) 5000 UNIT/ML IJ SOLN
5000.0000 [IU] | Freq: Three times a day (TID) | INTRAMUSCULAR | Status: DC
  Administered 2015-09-16 – 2015-09-18 (×8): 5000 [IU] via SUBCUTANEOUS
  Filled 2015-09-16 (×8): qty 1

## 2015-09-16 MED ORDER — SODIUM CHLORIDE 0.9 % IV SOLN
INTRAVENOUS | Status: AC
  Administered 2015-09-16: 05:00:00 via INTRAVENOUS

## 2015-09-16 MED ORDER — LORAZEPAM 0.5 MG PO TABS
0.5000 mg | ORAL_TABLET | Freq: Three times a day (TID) | ORAL | Status: DC | PRN
  Administered 2015-09-16: 0.5 mg via ORAL
  Filled 2015-09-16: qty 1

## 2015-09-16 MED ORDER — METOPROLOL SUCCINATE ER 25 MG PO TB24
25.0000 mg | ORAL_TABLET | Freq: Two times a day (BID) | ORAL | Status: DC
  Administered 2015-09-16 – 2015-09-18 (×5): 25 mg via ORAL
  Filled 2015-09-16 (×5): qty 1

## 2015-09-16 MED ORDER — ONDANSETRON HCL 4 MG/2ML IJ SOLN: 4.0000 mg | Freq: Three times a day (TID) | INTRAMUSCULAR | Status: AC | PRN

## 2015-09-16 MED ORDER — DOXAZOSIN MESYLATE 2 MG PO TABS
2.0000 mg | ORAL_TABLET | Freq: Every day | ORAL | Status: DC
  Administered 2015-09-16 – 2015-09-17 (×2): 2 mg via ORAL
  Filled 2015-09-16 (×3): qty 1

## 2015-09-16 MED ORDER — HYDROCODONE-ACETAMINOPHEN 7.5-325 MG PO TABS
1.0000 | ORAL_TABLET | Freq: Once | ORAL | Status: AC
Start: 2015-09-16 — End: 2015-09-16
  Administered 2015-09-16: 1 via ORAL
  Filled 2015-09-16: qty 1

## 2015-09-16 MED ORDER — ATORVASTATIN CALCIUM 20 MG PO TABS
20.0000 mg | ORAL_TABLET | Freq: Every day | ORAL | Status: DC
  Administered 2015-09-16 – 2015-09-18 (×3): 20 mg via ORAL
  Filled 2015-09-16 (×3): qty 1

## 2015-09-16 MED ORDER — ACETAMINOPHEN 325 MG PO TABS
650.0000 mg | ORAL_TABLET | ORAL | Status: DC | PRN
  Administered 2015-09-16: 650 mg via ORAL
  Filled 2015-09-16 (×2): qty 2

## 2015-09-16 NOTE — ED Notes (Signed)
Left message at the phone number that was provided by wife to staff to inform her about pt's admission, it seems to be patient's phone though. [phone]

## 2015-09-16 NOTE — ED Notes (Signed)
Attempted report, nurse is going to call back if she has questions, [facility] is pulling into drive to pick up patient.

## 2015-09-16 NOTE — Progress Notes (Signed)
Reedy Doell [id number] Admitted to [id number]: 09/16/2015 3:56 AM Attending Provider: Norval Morton, MD    Shawn Weaver is a 81 y.o. male patient admitted from ED awake, alert  & orientated  X 3,  No Order, VSS - Blood pressure 162/65, pulse 72, temperature 98.4 F (36.9 C), temperature source Oral, resp. rate 18, height 6' (1.829 m), weight 121.564 kg (268 lb), SpO2 100 %.RA, no c/o shortness of breath, no c/o chest pain, no distress noted. Tele # [id number] placed and pt is currently running: SR   IV site WDL:  with a transparent dsg that's clean dry and intact.  Allergies:   Allergies  Allergen Reactions  . Asa [Aspirin] Other (See Comments)    Upset stomach  . Codeine Rash     Past Medical History  Diagnosis Date  . Hyperlipidemia   . Cancer [facility])     History:  obtained from patient  Pt orientation to unit, room and routine. Information packet given to patient/family and safety video watched.  Admission INP armband ID verified with patient, and in place. SR up x 2, fall risk assessment complete with patient verbalizing understanding of risks associated with falls. Pt verbalizes an understanding of how to use the call bell and to call for help before getting out of bed.  Skin, clean-dry- intact without evidence of bruising, or skin tears.   No evidence of skin break down noted on exam. Patient does have a urostomy on his RLQ.  Will cont to monitor and assist as needed.  Parthenia Ames, RN 09/16/2015 3:56 AM

## 2015-09-16 NOTE — H&P (Signed)
History and Physical    Shawn Weaver [id number] DOB: [birth year] DOA: 09/15/2015  PCP: [name], MD   Patient coming from: Home, by way of [facility] ED   Chief Complaint: Weakness, fatigue, cough, dyspnea   HPI: Shawn Weaver is a 81 y.o. male with medical history significant for bladder cancer status post resection now with suprapubic catheter, hyperlipidemia, depression, hypertension, and seasonal allergies who presents the emergency department in transfer from [facility] for ongoing evaluation and management of community-acquired pneumonia. Per report of the patient's wife, he has developed confusion, generalized weakness, and fatigue over the past several months to a year with acute worsening over the past 3 days or so. She also reports that he has not been compliant with his medications. Over the past 3 days, he has been in increasingly tired and has developed a significant cough, particularly worse on the day of his presentation. He has denied chest pain or palpitations, but notes cough productive of thick clear sputum and dyspnea with minimal exertion. He denies fevers, but notes chills for the past few days. He also endorses mild frontal headache which he describes as not unusual for him. He denies change in vision or hearing, loss of coordination, or focal numbness or weakness. He has not been on antibiotics recently and there is been no recent long distance travel or sick contacts.  ED Course: Upon arrival to the [facility] ED, patient is found to be febrile to 39.1 C, saturating well on room air, and with vital signs otherwise stable. EKG demonstrated a sinus rhythm with first-degree AV block and diffuse T-wave inversions. Chest x-ray is notable for a left basilar opacity consistent with pneumonia. Lactic acid is reassuring at 1.61. CMP is notable for sodium 131, chloride 97, BUN 38, and serum creatinine 2.31, up from 1.49 on last measurement. Total bilirubin is mildly elevated to  1.4. CBC is notable for a normocytic anemia with hemoglobin of 11.4. Blood cultures were obtained and the patient was given a 1 L normal saline bolus. He was treated with acetaminophen with reduction in his temperature. Empiric anabiotic's with Rocephin and azithromycin were started in the outside ED and the patient remained hemodynamically stable and saturating well on room air. He was accepted in transfer to medical/surgical unit at [facility] for ongoing evaluation and management of the aforementioned complaints suspected secondary to community-acquired pneumonia.  Review of Systems:  All other systems reviewed and apart from HPI, are negative.  Past Medical History  Diagnosis Date  . Hyperlipidemia   . Cancer [facility])     Past Surgical History  Procedure Laterality Date  . Circumcision    . Bladder removed    . Suprapubic catheter placement    . Colostomy  2015     reports that he has never smoked. He does not have any smokeless tobacco history on file. He reports that he does not drink alcohol or use illicit drugs.  Allergies  Allergen Reactions  . Asa [Aspirin] Other (See Comments)    Upset stomach  . Codeine Rash    History reviewed. No pertinent family history.   Prior to Admission medications   Medication Sig Start Date End Date Taking? Authorizing Provider  atorvastatin (LIPITOR) 20 MG tablet Take 20 mg by mouth daily.   Yes Historical Provider, MD  citalopram (CELEXA) 20 MG tablet Take 20 mg by mouth daily.   Yes Historical Provider, MD  doxazosin (CARDURA) 2 MG tablet Take 2 mg by mouth at bedtime.  Yes Historical Provider, MD  fluticasone (FLONASE) 50 MCG/ACT nasal spray Place 1 spray into both nostrils daily.   Yes Historical Provider, MD  metoprolol succinate (TOPROL-XL) 25 MG 24 hr tablet Take 25 mg by mouth 2 (two) times daily.   Yes Historical Provider, MD  triamterene-hydrochlorothiazide (MAXZIDE-25) 37.5-25 MG tablet Take 1 tablet by mouth daily.   Yes  Historical Provider, MD  cetirizine (ZYRTEC) 10 MG tablet Take 10 mg by mouth daily.    Historical Provider, MD  furosemide (LASIX) 20 MG tablet Take 20 mg by mouth.    Historical Provider, MD  ibuprofen (ADVIL,MOTRIN) 800 MG tablet Take 1 tablet (800 mg total) by mouth 3 (three) times daily. 05/18/15   April Palumbo, MD  LORazepam (ATIVAN) 0.5 MG tablet Take 0.5 mg by mouth every 8 (eight) hours as needed for anxiety.    Historical Provider, MD    Physical Exam: Filed Vitals:   09/16/15 0130 09/16/15 0200 09/16/15 0313 09/16/15 0314  BP: 105/58 120/62 171/67 162/65  Pulse: 64 60 75 72  Temp:  98.3 F (36.8 C) 98.4 F (36.9 C)   TempSrc:  Oral Oral   Resp: 17 18 18    Height:      Weight:      SpO2: 100% 100% 99% 100%      Constitutional: NAD, calm, comfortable Eyes: PERTLA, lids and conjunctivae normal ENMT: Mucous membranes are moist. Posterior pharynx clear of any exudate or lesions.   Neck: normal, supple, no masses, no thyromegaly Respiratory: clear to auscultation bilaterally. Normal respiratory effort. No accessory muscle use.  Cardiovascular: S1 & S2 heard, regular rate and rhythm, soft systolic murmur at apex. No extremity edema. No significant JVD. Abdomen: No distension, no tenderness, no masses palpated. Bowel sounds normal. Colostomy site c/d/i, no surrounding erythema  Musculoskeletal: no clubbing / cyanosis. No joint deformity upper and lower extremities. Normal muscle tone.  Skin: no significant rashes, lesions, ulcers. Warm, dry, well-perfused. Neurologic: CN 2-12 grossly intact. Sensation intact, DTR normal. Strength 5/5 in all 4 limbs.  Psychiatric: Normal judgment and insight. Alert and oriented to person and place only. Normal mood and affect.     Labs on Admission: I have personally reviewed following labs and imaging studies  CBC:  Recent Labs Lab 09/15/15 2009  WBC 8.3  HGB 11.4*  HCT 35.2*  MCV 93.6  PLT A999333   Basic Metabolic Panel:  Recent  Labs Lab 09/15/15 2009  NA 131*  K 4.2  CL 97*  CO2 23  GLUCOSE 124*  BUN 38*  CREATININE 2.31*  CALCIUM 8.7*   GFR: Estimated Creatinine Clearance: 34.9 mL/min (by C-G formula based on Cr of 2.31). Liver Function Tests:  Recent Labs Lab 09/15/15 2009  AST 20  ALT 12*  ALKPHOS 66  BILITOT 1.4*  PROT 8.1  ALBUMIN 3.8   No results for input(s): LIPASE, AMYLASE in the last 168 hours. No results for input(s): AMMONIA in the last 168 hours. Coagulation Profile: No results for input(s): INR, PROTIME in the last 168 hours. Cardiac Enzymes: No results for input(s): CKTOTAL, CKMB, CKMBINDEX, TROPONINI in the last 168 hours. BNP (last 3 results) No results for input(s): PROBNP in the last 8760 hours. HbA1C: No results for input(s): HGBA1C in the last 72 hours. CBG: No results for input(s): GLUCAP in the last 168 hours. Lipid Profile: No results for input(s): CHOL, HDL, LDLCALC, TRIG, CHOLHDL, LDLDIRECT in the last 72 hours. Thyroid Function Tests: No results for input(s): TSH, T4TOTAL, FREET4,  T3FREE, THYROIDAB in the last 72 hours. Anemia Panel: No results for input(s): VITAMINB12, FOLATE, FERRITIN, TIBC, IRON, RETICCTPCT in the last 72 hours. Urine analysis:    Component Value Date/Time   COLORURINE AMBER* 10/04/2014 1300   APPEARANCEUR TURBID* 10/04/2014 1300   LABSPEC 1.015 10/04/2014 1300   PHURINE 7.0 10/04/2014 1300   GLUCOSEU NEGATIVE 10/04/2014 1300   HGBUR LARGE* 10/04/2014 1300   BILIRUBINUR NEGATIVE 10/04/2014 1300   KETONESUR NEGATIVE 10/04/2014 1300   PROTEINUR >300* 10/04/2014 1300   UROBILINOGEN 0.2 10/04/2014 1300   NITRITE POSITIVE* 10/04/2014 1300   LEUKOCYTESUR LARGE* 10/04/2014 1300   Sepsis Labs: @LABRCNTIP (procalcitonin:4,lacticidven:4) )No results found for this or any previous visit (from the past 240 hour(s)).   Radiological Exams on Admission: Dg Chest 2 View  09/15/2015  CLINICAL DATA:  Fever and weakness.  Altered mental status  EXAM: CHEST  2 VIEW COMPARISON:  06/03/2015 FINDINGS: Indistinct left basilar opacity. This area was clear on abdominal CT 06/20/2015. Lung volumes are mildly low. Chronic cardiomegaly. Stable mediastinal contours. There is no edema, effusion, or pneumothorax. IMPRESSION: Left basilar opacity concerning for pneumonia in this clinical setting. Electronically Signed   By: Monte Fantasia M.D.   On: 09/15/2015 21:01    EKG: Independently reviewed. Sinus rhythm, 1st degree AV block, diffuse T-wave inversions   Assessment/Plan  1. Community-acquired PNA  - Pt presents with new cough, fever, infiltrate on CXR  - Blood cultures are incubating, sputum culture requested  - Empiric treatment with Rocephin and azithromycin started in ED, will continue this while awaiting culture data  - Check urine antigens to strep pneumo and legionella    2. AKI superimposed on CKD stage III   - SCr 2.31 on admission, up from 1.49 on most recent prior (1 yr ago)  - Suspect this represents a prerenal azotemia in setting of acute infection; ATN possible; also possible that this represents progression of his chronic kidney disease in last yr - Anticipate improvement with IVF hydration and avoidance of nephrotoxins  - 1 liter NS bolus given in ED  - Hold Lasix, triamterene, and HCTZ for now while providing a gentle IVF hydration  - Repeat chem panel tomorrow  - If fails to improve as expected, may need to extend the workup to include a renal US and urine studies    3. Hyponatremia  - Serum sodium 131 on admission in setting of apparent dehydration and HCTZ use  - Holding HCTZ while providing IVF hydration  - Repeat chem panel tomorrow   4. Normocytic anemia  - Hgb 11.4 on admission with normal MCV - Hgb was wnl a year ago  - No s/s of active blood-loss  - Check iron studies, B12, folate, and supplement prn   5. Generalized weakness, fatigue   - This has been going on for several mos to a yr per pt's wife  -  Uncertain etiology; possibly d/t worsening kidney disease  - Will check thyroid studies, B12, folate - Depression may be contributing  - PT eval requested    6. EKG changes  - EKG features diffuse T-wave inversions  - There has been no anginal complaints  - Check a troponin; repeat EKG in am, sooner for angina    7. Depression  - Pt reports this is stable and denies SI, HI, or hallucinations  - Continue current management with Celexa   DVT prophylaxis: sq heparin  Code Status: Full  Family Communication: Discussed with patient  Disposition Plan: Admit to  med-surg  Consults called: None  Admission status: Inpatient     Vianne Bulls, MD [facility] Pager [phone]  If 7PM-7AM, please contact night-coverage [email or web address] Password [email or web address]  09/16/2015, 4:36 AM

## 2015-09-16 NOTE — Progress Notes (Addendum)
[facility]                                                                              Patient Demographics  Shawn Weaver, is a 81 y.o. male, DOB - 25-Jul-1934, TA:[id number]  Admit date - 09/15/2015   Admitting Physician Vianne Bulls, MD  Outpatient Primary MD for the patient is [name], MD  Outpatient specialists:   LOS - 1  days    Chief Complaint  Patient presents with  . Altered Mental Status       Brief summary   Shawn Weaver is a 81 y.o. male with bladder cancer status post resection now with suprapubic catheter, hyperlipidemia, depression, hypertension Presented with weakness, fatigue cough and dyspnea. Per report of the patient's wife, he has developed confusion, generalized weakness, and fatigue over the past several months to a year with acute worsening over the past 3 days or so. She also reports that he has not been compliant with his medications. Over the past 3 days, he has been in increasingly tired and has developed a significant cough, particularly worse on the day of his presentation. He has denied chest pain or palpitations, but notes cough productive of thick clear sputum and dyspnea with minimal exertion. He denies fevers, but notes chills for the past few days. He also endorses mild frontal headache which he describes as not unusual for him. He denies change in vision or hearing, loss of coordination, or focal numbness or weakness. He has not been on antibiotics recently and there is been no recent long distance travel or sick contacts.  ED Course: In ED, patient was febrile at 102.79F, otherwise vital signs were stable  EKG demonstrated a sinus rhythm with first-degree AV block and diffuse T-wave inversions.  Chest x-ray is notable for a left basilar opacity consistent with pneumonia. sodium 131, chloride 97, BUN 38, and serum creatinine 2.31, up from 1.49 on last measurement. Total bilirubin is mildly elevated to 1.4. CBC is notable for a  normocytic anemia with hemoglobin of 11.4. Blood cultures were obtained.   Assessment & Plan     Community-acquired PNA : presents with new cough, fever, infiltrate on CXR  - Continue IV Rocephin and Zithromax, follow blood cultures - Urine strep antigen negative B  Ecoli bacteremia, UTI   - Blood cultures positive for EColi possibly from UTI, urine Cuture pending  - Rocephin increased to 2g IV q24hrs, follow sensitivities   . AKI superimposed on CKD stage III likely due to worsening of chronic kidney disease or due to #1, patient is also on medications including Lasix, triamterene, HCTZ - SCr 2.31 on admission, up from 1.49 on most recent prior (1 yr ago)  - Follow BMET, continue gentle hydration today  Hyponatremia  - Serum sodium 131 on admission in setting of apparent dehydration and HCTZ use  - Holding HCTZ while providing IVF hydration  - Repeat chem panel tomorrow   Normocytic anemia  - Hgb 11.4 on admission with normal MCV - No active blood-loss  - Check iron studies, B12, folate, and supplement prn   Chronic Generalized weakness, fatigue feels improving today -  PT OT evaluation, lactic acid, B12 normal  - TSH pending  EKG changes slightly elevated troponin likely due to sepsis/ bacteremia  - EKG features diffuse T-wave inversions, no chest pain or shortness of breath  - follow 2-D echocardiogram and serial cardiac enzymes. If wall motion abnormalities or depressed EF, will consult cardiology  Depression  - Pt reports this is stable and denies SI, HI, or hallucinations  - Continue current management with Celexa   Code Status: full code  DVT Prophylaxis: heparin Family Communication: Discussed in detail with the patient, all imaging results, lab results explained to the patient    Disposition Plan:   Time Spent in minutes   25 minutes  Procedures:  Chest x-ray  Consults :   None  Antimicrobials :   I Zithromax 7/21    IV Rocephin  7/21   Medications  Scheduled Meds: . atorvastatin  20 mg Oral Daily  . azithromycin  500 mg Oral Q24H  . cefTRIAXone (ROCEPHIN)  IV  1 g Intravenous Q24H  . citalopram  20 mg Oral Daily  . doxazosin  2 mg Oral QHS  . fluticasone  1 spray Each Nare Daily  . heparin  5,000 Units Subcutaneous Q8H  . loratadine  10 mg Oral Daily  . metoprolol succinate  25 mg Oral BID   Continuous Infusions: . sodium chloride 100 mL/hr at 09/16/15 0507   PRN Meds:.LORazepam, ondansetron (ZOFRAN) IV   Antibiotics   Anti-infectives    Start     Dose/Rate Route Frequency Ordered Stop   09/16/15 2200  cefTRIAXone (ROCEPHIN) 1 g in dextrose 5 % 50 mL IVPB     1 g 100 mL/hr over 30 Minutes Intravenous Every 24 hours 09/16/15 0434 09/22/15 2159   09/16/15 2200  azithromycin (ZITHROMAX) tablet 500 mg     500 mg Oral Every 24 hours 09/16/15 0434 09/22/15 2159   09/15/15 2351  azithromycin (ZITHROMAX) 500 MG injection    Comments:  Miguel Rota   : cabinet override      09/15/15 2351 09/16/15 1159   09/15/15 2230  cefTRIAXone (ROCEPHIN) 1 g in dextrose 5 % 50 mL IVPB     1 g 100 mL/hr over 30 Minutes Intravenous  Once 09/15/15 2226 09/15/15 2341   09/15/15 2230  azithromycin (ZITHROMAX) 500 mg in dextrose 5 % 250 mL IVPB     500 mg 250 mL/hr over 60 Minutes Intravenous  Once 09/15/15 2226 09/16/15 0152        Subjective:   Shawn Weaver was seen and examined today. Feeling better today, no chest pain or shortness of breath. Patient denies dizziness,  abdominal pain, N/V/D/C, new weakness, numbess, tingling. No acute events overnight.   no fevers overnight.   Objective:   Filed Vitals:   09/16/15 0200 09/16/15 0313 09/16/15 0314 09/16/15 0553  BP: 120/62 171/67 162/65 133/53  Pulse: 60 75 72 74  Temp: 98.3 F (36.8 C) 98.4 F (36.9 C)  98.4 F (36.9 C)  TempSrc: Oral Oral  Oral  Resp: 18 18  18   Height:      Weight:      SpO2: 100% 99% 100% 95%    Intake/Output Summary (Last 24 hours)  at 09/16/15 1236 Last data filed at 09/16/15 1128  Gross per 24 hour  Intake 968.33 ml  Output    525 ml  Net 443.33 ml     Wt Readings from Last 3 Encounters:  09/15/15 121.564 kg (268 lb)  05/17/15  121.564 kg (268 lb)  10/04/14 120.203 kg (265 lb)     Exam  General: Alert and oriented, NAD  HEENT:  PERRLA, EOMI, Anicteric Sclera, mucous membranes moist.   Neck: Supple, no JVD  Cardiovascular: S1 S2 auscultated, no rubs, murmurs or gallops. Regular rate and rhythm.  Respiratory: Decreased breath sounds at the bases   Gastrointestinal: Soft, nontender, nondistended, + bowel sounds  Ext: no cyanosis clubbing or edema  Neuro: no new deficits  Skin: No rashes  Psych: Normal affect and demeanor, alert and oriented   Data Reviewed:  I have personally reviewed following labs and imaging studies  Micro Results Recent Results (from the past 240 hour(s))  Culture, blood (Routine x 2)     Status: None (Preliminary result)   Collection Time: 09/15/15  8:15 PM  Result Value Ref Range Status   Specimen Description   Final    BLOOD RIGHT HAND BOTTLES DRAWN AEROBIC AND ANAEROBIC   Special Requests NONE  Final   Culture  Setup Time   Final    GRAM NEGATIVE RODS AEROBIC BOTTLE ONLY Organism ID to follow Performed at [facility]    Culture PENDING  Incomplete   Report Status PENDING  Incomplete    Radiology Reports Dg Chest 2 View  09/15/2015  CLINICAL DATA:  Fever and weakness.  Altered mental status EXAM: CHEST  2 VIEW COMPARISON:  06/03/2015 FINDINGS: Indistinct left basilar opacity. This area was clear on abdominal CT 06/20/2015. Lung volumes are mildly low. Chronic cardiomegaly. Stable mediastinal contours. There is no edema, effusion, or pneumothorax. IMPRESSION: Left basilar opacity concerning for pneumonia in this clinical setting. Electronically Signed   By: Monte Fantasia M.D.   On: 09/15/2015 21:01    Lab Data:  CBC:  Recent Labs Lab  09/15/15 2009  WBC 8.3  HGB 11.4*  HCT 35.2*  MCV 93.6  PLT A999333   Basic Metabolic Panel:  Recent Labs Lab 09/15/15 2009  NA 131*  K 4.2  CL 97*  CO2 23  GLUCOSE 124*  BUN 38*  CREATININE 2.31*  CALCIUM 8.7*   GFR: Estimated Creatinine Clearance: 34.9 mL/min (by C-G formula based on Cr of 2.31). Liver Function Tests:  Recent Labs Lab 09/15/15 2009  AST 20  ALT 12*  ALKPHOS 66  BILITOT 1.4*  PROT 8.1  ALBUMIN 3.8   No results for input(s): LIPASE, AMYLASE in the last 168 hours. No results for input(s): AMMONIA in the last 168 hours. Coagulation Profile: No results for input(s): INR, PROTIME in the last 168 hours. Cardiac Enzymes:  Recent Labs Lab 09/16/15 0708 09/16/15 0850  TROPONINI 0.09* 0.08*   BNP (last 3 results) No results for input(s): PROBNP in the last 8760 hours. HbA1C: No results for input(s): HGBA1C in the last 72 hours. CBG: No results for input(s): GLUCAP in the last 168 hours. Lipid Profile: No results for input(s): CHOL, HDL, LDLCALC, TRIG, CHOLHDL, LDLDIRECT in the last 72 hours. Thyroid Function Tests:  Recent Labs  09/16/15 0442  FREET4 0.82   Anemia Panel:  Recent Labs  09/16/15 0442  VITAMINB12 410  FERRITIN 597*  TIBC 192*  IRON 15*   Urine analysis:    Component Value Date/Time   COLORURINE YELLOW 09/16/2015 0900   APPEARANCEUR CLOUDY* 09/16/2015 0900   LABSPEC 1.010 09/16/2015 0900   PHURINE 6.5 09/16/2015 0900   GLUCOSEU NEGATIVE 09/16/2015 0900   HGBUR MODERATE* 09/16/2015 0900   BILIRUBINUR NEGATIVE 09/16/2015 0900   KETONESUR NEGATIVE 09/16/2015 0900  PROTEINUR NEGATIVE 09/16/2015 0900   UROBILINOGEN 0.2 10/04/2014 1300   NITRITE NEGATIVE 09/16/2015 0900   LEUKOCYTESUR LARGE* 09/16/2015 0900     Shawn Weaver M.D. [facility] 09/16/2015, 12:36 PM  Pager: [phone] Between 7am to 7pm - call Pager - [phone]  After 7pm go to [email or web address] - password [email or web address]  Call night coverage person  covering after 7pm

## 2015-09-16 NOTE — Progress Notes (Signed)
PHARMACY - PHYSICIAN COMMUNICATION CRITICAL VALUE ALERT - BLOOD CULTURE IDENTIFICATION (BCID)  Results for orders placed or performed during the hospital encounter of 09/15/15  Blood Culture ID Panel (Reflexed) (Collected: 09/15/2015  8:15 PM)  Result Value Ref Range   Enterococcus species NOT DETECTED NOT DETECTED   Vancomycin resistance NOT DETECTED NOT DETECTED   Listeria monocytogenes NOT DETECTED NOT DETECTED   Staphylococcus species NOT DETECTED NOT DETECTED   Staphylococcus aureus NOT DETECTED NOT DETECTED   Methicillin resistance NOT DETECTED NOT DETECTED   Streptococcus species NOT DETECTED NOT DETECTED   Streptococcus agalactiae NOT DETECTED NOT DETECTED   Streptococcus pneumoniae NOT DETECTED NOT DETECTED   Streptococcus pyogenes NOT DETECTED NOT DETECTED   Acinetobacter baumannii NOT DETECTED NOT DETECTED   Enterobacteriaceae species DETECTED (A) NOT DETECTED   Enterobacter cloacae complex NOT DETECTED NOT DETECTED   Escherichia coli DETECTED (A) NOT DETECTED   Klebsiella oxytoca NOT DETECTED NOT DETECTED   Klebsiella pneumoniae NOT DETECTED NOT DETECTED   Proteus species NOT DETECTED NOT DETECTED   Serratia marcescens NOT DETECTED NOT DETECTED   Carbapenem resistance NOT DETECTED NOT DETECTED   Haemophilus influenzae NOT DETECTED NOT DETECTED   Neisseria meningitidis NOT DETECTED NOT DETECTED   Pseudomonas aeruginosa NOT DETECTED NOT DETECTED   Candida albicans NOT DETECTED NOT DETECTED   Candida glabrata NOT DETECTED NOT DETECTED   Candida krusei NOT DETECTED NOT DETECTED   Candida parapsilosis NOT DETECTED NOT DETECTED   Candida tropicalis NOT DETECTED NOT DETECTED    Name of physician (or Provider) Contacted: Dr. Tana Coast  Changes to prescribed antibiotics required: Increase ceftriaxone to 2g IV q24h, continue azithromycin   Dimitri Ped, PharmD. PGY-2 Pharmacy Resident Pager: [phone] 09/16/2015  2:54 PM

## 2015-09-16 NOTE — Evaluation (Signed)
Physical Therapy Evaluation Patient Details Name: Shawn Weaver MRN: [id number] DOB: 11-05-1934 Today's Date: 09/16/2015   History of Present Illness  Patient is a 81 yo male admitted 09/15/15 with cough, weakness, AMS.  Patient with CAP, AKI, increased troponin - coming down.   PMH:  HLD, HTN, OSA, suprapubic catheter due to CA, CKD  Clinical Impression  Patient presents with problems listed below.  Will benefit from acute PT to maximize functional independence.  Patient able to ambulate 72' with no assistive device and min guard assist.  Do not anticipate any f/u PT needs at d/c.    Follow Up Recommendations No PT follow up;Supervision for mobility/OOB    Equipment Recommendations  None recommended by PT (Will continue to assess)    Recommendations for Other Services       Precautions / Restrictions Precautions Precautions: Fall Restrictions Weight Bearing Restrictions: No      Mobility  Bed Mobility Overal bed mobility: Modified Independent             General bed mobility comments: Increased time  Transfers Overall transfer level: Needs assistance Equipment used: None Transfers: Sit to/from Stand Sit to Stand: Min guard         General transfer comment: Assist for safety.  Ambulation/Gait Ambulation/Gait assistance: Min guard Ambulation Distance (Feet): 50 Feet Assistive device: None Gait Pattern/deviations: Step-through pattern;Decreased stride length;Trunk flexed Gait velocity: decreased Gait velocity interpretation: Below normal speed for age/gender General Gait Details: Patient with slow, steady gait.  No assistive device used.  Patient with good balance during gait.  Reports he feels he is about at his baseline for gait.  Stairs            Wheelchair Mobility    Modified Rankin (Stroke Patients Only)       Balance Overall balance assessment: Needs assistance Sitting-balance support: No upper extremity supported;Feet supported Sitting  balance-Leahy Scale: Good     Standing balance support: No upper extremity supported Standing balance-Leahy Scale: Good                               Pertinent Vitals/Pain Pain Assessment: No/denies pain    Home Living Family/patient expects to be discharged to:: Private residence Living Arrangements: Spouse/significant other Available Help at Discharge: Family;Available 24 hours/day Type of Home: House Home Access: Stairs to enter Entrance Stairs-Rails: Doctor, general practice of Steps: 3 Home Layout: One level Home Equipment: None      Prior Function Level of Independence: Independent         Comments: Per patient, he drives     Hand Dominance        Extremity/Trunk Assessment   Upper Extremity Assessment: Overall WFL for tasks assessed           Lower Extremity Assessment: Generalized weakness         Communication   Communication: No difficulties  Cognition Arousal/Alertness: Awake/alert Behavior During Therapy: WFL for tasks assessed/performed;Flat affect Overall Cognitive Status: No family/caregiver present to determine baseline cognitive functioning                      General Comments      Exercises        Assessment/Plan    PT Assessment Patient needs continued PT services  PT Diagnosis Abnormality of gait;Generalized weakness   PT Problem List Decreased strength;Decreased mobility;Decreased balance  PT Treatment Interventions Gait training;DME instruction;Functional mobility training;Stair training;Therapeutic  activities;Patient/family education   PT Goals (Current goals can be found in the Care Plan section) Acute Rehab PT Goals Patient Stated Goal: To go home tomorrow PT Goal Formulation: With patient Time For Goal Achievement: 09/22/15 Potential to Achieve Goals: Good    Frequency Min 3X/week   Barriers to discharge        Co-evaluation               End of Session   Activity  Tolerance: Patient tolerated treatment well;Patient limited by pain Patient left: in bed;with call bell/phone within reach;with bed alarm set Nurse Communication: Mobility status (Sweating and needs linens changed)         Time: [id number] PT Time Calculation (min) (ACUTE ONLY): 21 min   Charges:   PT Evaluation $PT Eval Moderate Complexity: 1 Procedure     PT G CodesDespina Pole 2015-10-13, 6:54 PM Carita Pian. Sanjuana Kava, [place] Pager [phone]

## 2015-09-17 ENCOUNTER — Inpatient Hospital Stay (HOSPITAL_COMMUNITY): Payer: Medicare Other

## 2015-09-17 DIAGNOSIS — R7989 Other specified abnormal findings of blood chemistry: Secondary | ICD-10-CM

## 2015-09-17 LAB — CBC
HCT: 35.2 % — ABNORMAL LOW (ref 39.0–52.0)
HEMOGLOBIN: 11.1 g/dL — AB (ref 13.0–17.0)
MCH: 29.8 pg (ref 26.0–34.0)
MCHC: 31.5 g/dL (ref 30.0–36.0)
MCV: 94.6 fL (ref 78.0–100.0)
PLATELETS: 199 10*3/uL (ref 150–400)
RBC: 3.72 MIL/uL — AB (ref 4.22–5.81)
RDW: 13.6 % (ref 11.5–15.5)
WBC: 6.1 10*3/uL (ref 4.0–10.5)

## 2015-09-17 LAB — BASIC METABOLIC PANEL
ANION GAP: 9 (ref 5–15)
BUN: 29 mg/dL — ABNORMAL HIGH (ref 6–20)
CALCIUM: 8.5 mg/dL — AB (ref 8.9–10.3)
CO2: 24 mmol/L (ref 22–32)
CREATININE: 1.85 mg/dL — AB (ref 0.61–1.24)
Chloride: 104 mmol/L (ref 101–111)
GFR calc Af Amer: 38 mL/min — ABNORMAL LOW (ref >60)
GFR calc non Af Amer: 33 mL/min — ABNORMAL LOW (ref >60)
GLUCOSE: 89 mg/dL (ref 65–99)
Potassium: 4.4 mmol/L (ref 3.5–5.1)
Sodium: 137 mmol/L (ref 135–145)

## 2015-09-17 LAB — HIV ANTIBODY (ROUTINE TESTING W REFLEX): HIV SCREEN 4TH GENERATION: NONREACTIVE

## 2015-09-17 LAB — URINE CULTURE

## 2015-09-17 LAB — ECHOCARDIOGRAM COMPLETE
HEIGHTINCHES: 72 in
WEIGHTICAEL: 4288 [oz_av]

## 2015-09-17 MED ORDER — PERFLUTREN LIPID MICROSPHERE
1.0000 mL | INTRAVENOUS | Status: AC | PRN
  Administered 2015-09-17: 2 mL via INTRAVENOUS
  Filled 2015-09-17: qty 10

## 2015-09-17 MED ORDER — HYDROCODONE-ACETAMINOPHEN 7.5-325 MG PO TABS
1.0000 | ORAL_TABLET | Freq: Once | ORAL | Status: AC
  Administered 2015-09-17: 1 via ORAL
  Filled 2015-09-17: qty 1

## 2015-09-17 MED ORDER — SENNOSIDES-DOCUSATE SODIUM 8.6-50 MG PO TABS
1.0000 | ORAL_TABLET | Freq: Two times a day (BID) | ORAL | Status: DC
  Administered 2015-09-17 – 2015-09-18 (×3): 1 via ORAL
  Filled 2015-09-17 (×3): qty 1

## 2015-09-17 MED ORDER — POLYETHYLENE GLYCOL 3350 17 G PO PACK: 17.0000 g | PACK | Freq: Every day | ORAL | Status: DC | PRN

## 2015-09-17 NOTE — Progress Notes (Signed)
  Echocardiogram 2D Echocardiogram has been performed with definity.  Aggie Cosier 09/17/2015, 4:03 PM

## 2015-09-17 NOTE — Progress Notes (Signed)
[facility]                                                                              Patient Demographics  Shawn Weaver, is a 81 y.o. male, DOB - 08/24/1934, [id number]  Admit date - 09/15/2015   Admitting Physician Vianne Bulls, MD  Outpatient Primary MD for the patient is [name], MD  Outpatient specialists:   LOS - 2  days    Chief Complaint  Patient presents with  . Altered Mental Status       Brief summary   Shawn Weaver is a 81 y.o. male with bladder cancer status post resection now with suprapubic catheter, hyperlipidemia, depression, hypertension Presented with weakness, fatigue cough and dyspnea. Per report of the patient's wife, he has developed confusion, generalized weakness, and fatigue over the past several months to a year with acute worsening over the past 3 days or so. She also reports that he has not been compliant with his medications. Over the past 3 days, he has been in increasingly tired and has developed a significant cough, particularly worse on the day of his presentation. He has denied chest pain or palpitations, but notes cough productive of thick clear sputum and dyspnea with minimal exertion. He denies fevers, but notes chills for the past few days. He also endorses mild frontal headache which he describes as not unusual for him. He denies change in vision or hearing, loss of coordination, or focal numbness or weakness. He has not been on antibiotics recently and there is been no recent long distance travel or sick contacts.  ED Course: In ED, patient was febrile at 102.30F, otherwise vital signs were stable  EKG demonstrated a sinus rhythm with first-degree AV block and diffuse T-wave inversions.  Chest x-ray is notable for a left basilar opacity consistent with pneumonia. sodium 131, chloride 97, BUN 38, and serum creatinine 2.31, up from 1.49 on last measurement. Total bilirubin is mildly elevated to 1.4. CBC is notable for a  normocytic anemia with hemoglobin of 11.4. Blood cultures were obtained.   Assessment & Plan     Community-acquired PNA : presents with new cough, fever, infiltrate on CXR  - Continue IV Rocephin and Zithromax, follow blood cultures - Urine strep antigen negative B  Ecoli bacteremia, UTI   - Blood cultures positive for EColi possibly from UTI, urine Cuture pending  - Rocephin increased to 2g IV q24hrs, follow sensitivities    AKI superimposed on CKD stage III likely due to worsening of chronic kidney disease or due to Sepsis. patient is also on medications including Lasix, triamterene, HCTZ - SCr 2.31 on admission, up from 1.49 on most recent prior (1 yr ago)  - Continue gentle hydration, creatinine function improved to 1.8  - Continue to hold Lasix, triamterene, HCTZ  Hyponatremia - Serum sodium 131 on admission in setting of apparent dehydration and HCTZ use  - Holding HCTZ while providing IVF hydration  - Sodium improving  Normocytic anemia : Consistent with anemia of chronic disease - Hgb 11.4 on admission with normal MCV - No active blood-loss  - Ferritin 597 with saturation ratio  and iron low, consistent with anemia of chronic disease  Chronic Generalized weakness, fatigue feels improving today - PT OT evaluation recommended no PT follow-up  - B12 normal  - TSH 1.4  EKG changes slightly elevated troponin likely due to sepsis/ bacteremia  - EKG features diffuse T-wave inversions, no chest pain or shortness of breath  - Cardiac enzymes with slightly positive troponin and flat - Follow 2-D echo, If wall motion abnormalities or depressed EF, will consult cardiology  Depression  - Pt reports this is stable and denies SI, HI, or hallucinations  - Continue current management with Celexa   Code Status: full code  DVT Prophylaxis: heparin Family Communication: Discussed in detail with the patient, all imaging results, lab results explained to the patient     Disposition Plan:   Time Spent in minutes   25 minutes  Procedures:  Chest x-ray  Consults :   None  Antimicrobials :   I Zithromax 7/21    IV Rocephin 7/21   Medications  Scheduled Meds: . atorvastatin  20 mg Oral Daily  . azithromycin  500 mg Oral Q24H  . cefTRIAXone (ROCEPHIN)  IV  2 g Intravenous Q24H  . citalopram  20 mg Oral Daily  . doxazosin  2 mg Oral QHS  . fluticasone  1 spray Each Nare Daily  . heparin  5,000 Units Subcutaneous Q8H  . loratadine  10 mg Oral Daily  . metoprolol succinate  25 mg Oral BID   Continuous Infusions:   PRN Meds:.acetaminophen, LORazepam   Antibiotics   Anti-infectives    Start     Dose/Rate Route Frequency Ordered Stop   09/16/15 2200  cefTRIAXone (ROCEPHIN) 1 g in dextrose 5 % 50 mL IVPB  Status:  Discontinued     1 g 100 mL/hr over 30 Minutes Intravenous Every 24 hours 09/16/15 0434 09/16/15 1500   09/16/15 2200  azithromycin (ZITHROMAX) tablet 500 mg     500 mg Oral Every 24 hours 09/16/15 0434 09/22/15 2159   09/16/15 2200  cefTRIAXone (ROCEPHIN) 2 g in dextrose 5 % 50 mL IVPB     2 g 100 mL/hr over 30 Minutes Intravenous Every 24 hours 09/16/15 1500 09/22/15 2159   09/15/15 2351  azithromycin (ZITHROMAX) 500 MG injection    Comments:  Miguel Rota   : cabinet override      09/15/15 2351 09/16/15 1159   09/15/15 2230  cefTRIAXone (ROCEPHIN) 1 g in dextrose 5 % 50 mL IVPB     1 g 100 mL/hr over 30 Minutes Intravenous  Once 09/15/15 2226 09/15/15 2341   09/15/15 2230  azithromycin (ZITHROMAX) 500 mg in dextrose 5 % 250 mL IVPB     500 mg 250 mL/hr over 60 Minutes Intravenous  Once 09/15/15 2226 09/16/15 0152        Subjective:   Shawn Weaver was seen and examined today.Feels better today, denies any specific complaints overnight had spiked fever to 101, low-grade this morning. No chest pain. Patient denies dizziness,  abdominal pain, N/V/D/C, new weakness, numbess, tingling. No acute events overnight.    Objective:   Vitals:   09/16/15 1524 09/16/15 2113 09/16/15 2300 09/17/15 0520  BP: (!) 124/55 (!) 150/60  (!) 142/64  Pulse: 70 77  73  Resp: 17 18  18   Temp: 98.8 F (37.1 C) (!) 101 F (38.3 C) 99.1 F (37.3 C) 99.9 F (37.7 C)  TempSrc: Oral Oral Oral Oral  SpO2: 99% 97%  97%  Weight:  Height:        Intake/Output Summary (Last 24 hours) at 09/17/15 1207 Last data filed at 09/17/15 [id number]  Gross per 24 hour  Intake              890 ml  Output             1750 ml  Net             -860 ml     Wt Readings from Last 3 Encounters:  09/15/15 121.6 kg (268 lb)  05/17/15 121.6 kg (268 lb)  10/04/14 120.2 kg (265 lb)     Exam  General: Alert and oriented, NAD  HEENT:     Neck: Supple, no JVD  Cardiovascular: S1 S2 clear, RRR  Respiratory: Decreased breath sounds at the bases   Gastrointestinal: Soft, nontender, nondistended, + bowel sounds  Ext: no cyanosis clubbing or edema  Neuro: no new deficits  Skin: No rashes  Psych: Normal affect and demeanor, alert and oriented   Data Reviewed:  I have personally reviewed following labs and imaging studies  Micro Results Recent Results (from the past 240 hour(s))  Culture, blood (Routine x 2)     Status: None (Preliminary result)   Collection Time: 09/15/15  8:10 PM  Result Value Ref Range Status   Specimen Description BLOOD FOREARM BOTTLES DRAWN AEROBIC AND ANAEROBIC  Final   Special Requests NONE  Final   Culture   Final    NO GROWTH < 24 HOURS Performed at [facility]    Report Status PENDING  Incomplete  Culture, blood (Routine x 2)     Status: Abnormal (Preliminary result)   Collection Time: 09/15/15  8:15 PM  Result Value Ref Range Status   Specimen Description   Final    BLOOD RIGHT HAND BOTTLES DRAWN AEROBIC AND ANAEROBIC   Special Requests NONE  Final   Culture  Setup Time   Final    GRAM NEGATIVE RODS AEROBIC BOTTLE ONLY Organism ID to follow CRITICAL RESULT CALLED TO, READ  BACK BY AND VERIFIED WITH: T STONE,PHARMD AT 1454 09/16/15 BY L BENFIELD    Culture (A)  Final    ESCHERICHIA COLI SUSCEPTIBILITIES TO FOLLOW Performed at [facility]    Report Status PENDING  Incomplete  Blood Culture ID Panel (Reflexed)     Status: Abnormal   Collection Time: 09/15/15  8:15 PM  Result Value Ref Range Status   Enterococcus species NOT DETECTED NOT DETECTED Final   Vancomycin resistance NOT DETECTED NOT DETECTED Final   Listeria monocytogenes NOT DETECTED NOT DETECTED Final   Staphylococcus species NOT DETECTED NOT DETECTED Final   Staphylococcus aureus NOT DETECTED NOT DETECTED Final   Methicillin resistance NOT DETECTED NOT DETECTED Final   Streptococcus species NOT DETECTED NOT DETECTED Final   Streptococcus agalactiae NOT DETECTED NOT DETECTED Final   Streptococcus pneumoniae NOT DETECTED NOT DETECTED Final   Streptococcus pyogenes NOT DETECTED NOT DETECTED Final   Acinetobacter baumannii NOT DETECTED NOT DETECTED Final   Enterobacteriaceae species DETECTED (A) NOT DETECTED Final    Comment: CRITICAL RESULT CALLED TO, READ BACK BY AND VERIFIED WITH: T STONE,PHARMD AT 1454 09/16/15 BY L BENFIELD    Enterobacter cloacae complex NOT DETECTED NOT DETECTED Final   Escherichia coli DETECTED (A) NOT DETECTED Final    Comment: CRITICAL RESULT CALLED TO, READ BACK BY AND VERIFIED WITH: T STONE,PHARMD AT 1454 09/16/15 BY L BENFIELD    Klebsiella oxytoca NOT DETECTED NOT  DETECTED Final   Klebsiella pneumoniae NOT DETECTED NOT DETECTED Final   Proteus species NOT DETECTED NOT DETECTED Final   Serratia marcescens NOT DETECTED NOT DETECTED Final   Carbapenem resistance NOT DETECTED NOT DETECTED Final   Haemophilus influenzae NOT DETECTED NOT DETECTED Final   Neisseria meningitidis NOT DETECTED NOT DETECTED Final   Pseudomonas aeruginosa NOT DETECTED NOT DETECTED Final   Candida albicans NOT DETECTED NOT DETECTED Final   Candida glabrata NOT DETECTED NOT DETECTED  Final   Candida krusei NOT DETECTED NOT DETECTED Final   Candida parapsilosis NOT DETECTED NOT DETECTED Final   Candida tropicalis NOT DETECTED NOT DETECTED Final    Comment: Performed at [facility]  Urine culture     Status: Abnormal   Collection Time: 09/16/15  9:00 AM  Result Value Ref Range Status   Specimen Description URINE, SUPRAPUBIC  Final   Special Requests NONE  Final   Culture <10,000 COLONIES/mL INSIGNIFICANT GROWTH (A)  Final   Report Status 09/17/2015 FINAL  Final    Radiology Reports Dg Chest 2 View  Result Date: 09/15/2015 CLINICAL DATA:  Fever and weakness.  Altered mental status EXAM: CHEST  2 VIEW COMPARISON:  06/03/2015 FINDINGS: Indistinct left basilar opacity. This area was clear on abdominal CT 06/20/2015. Lung volumes are mildly low. Chronic cardiomegaly. Stable mediastinal contours. There is no edema, effusion, or pneumothorax. IMPRESSION: Left basilar opacity concerning for pneumonia in this clinical setting. Electronically Signed   By: Monte Fantasia M.D.   On: 09/15/2015 21:01    Lab Data:  CBC:  Recent Labs Lab 09/15/15 2009 09/17/15 0520  WBC 8.3 6.1  HGB 11.4* 11.1*  HCT 35.2* 35.2*  MCV 93.6 94.6  PLT 210 [id number]   Basic Metabolic Panel:  Recent Labs Lab 09/15/15 2009 09/17/15 0520  NA 131* 137  K 4.2 4.4  CL 97* 104  CO2 23 24  GLUCOSE 124* 89  BUN 38* 29*  CREATININE 2.31* 1.85*  CALCIUM 8.7* 8.5*   GFR: Estimated Creatinine Clearance: 43.6 mL/min (by C-G formula based on SCr of 1.85 mg/dL). Liver Function Tests:  Recent Labs Lab 09/15/15 2009  AST 20  ALT 12*  ALKPHOS 66  BILITOT 1.4*  PROT 8.1  ALBUMIN 3.8   No results for input(s): LIPASE, AMYLASE in the last 168 hours. No results for input(s): AMMONIA in the last 168 hours. Coagulation Profile: No results for input(s): INR, PROTIME in the last 168 hours. Cardiac Enzymes:  Recent Labs Lab 09/16/15 0708 09/16/15 0850 09/16/15 1441 09/16/15 2132   TROPONINI 0.09* 0.08* 0.07* 0.07*   BNP (last 3 results) No results for input(s): PROBNP in the last 8760 hours. HbA1C: No results for input(s): HGBA1C in the last 72 hours. CBG: No results for input(s): GLUCAP in the last 168 hours. Lipid Profile: No results for input(s): CHOL, HDL, LDLCALC, TRIG, CHOLHDL, LDLDIRECT in the last 72 hours. Thyroid Function Tests:  Recent Labs  09/15/15 2315 09/16/15 0442  TSH 1.409  --   FREET4  --  0.82   Anemia Panel:  Recent Labs  09/16/15 0442  VITAMINB12 410  FERRITIN 597*  TIBC 192*  IRON 15*   Urine analysis:    Component Value Date/Time   COLORURINE YELLOW 09/16/2015 0900   APPEARANCEUR CLOUDY (A) 09/16/2015 0900   LABSPEC 1.010 09/16/2015 0900   PHURINE 6.5 09/16/2015 0900   GLUCOSEU NEGATIVE 09/16/2015 0900   HGBUR MODERATE (A) 09/16/2015 0900   BILIRUBINUR NEGATIVE 09/16/2015 0900   KETONESUR  NEGATIVE 09/16/2015 0900   PROTEINUR NEGATIVE 09/16/2015 0900   UROBILINOGEN 0.2 10/04/2014 1300   NITRITE NEGATIVE 09/16/2015 0900   LEUKOCYTESUR LARGE (A) 09/16/2015 0900     Sakara Lehtinen M.D. [facility] 09/17/2015, 12:07 PM  Pager: [phone] Between 7am to 7pm - call Pager - [phone]  After 7pm go to [email or web address] - password [email or web address]  Call night coverage person covering after 7pm

## 2015-09-18 LAB — FOLATE RBC
Folate, RBC: >1845 ng/mL (ref >498)
HEMATOCRIT: 33.6 % — AB (ref 37.5–51.0)

## 2015-09-18 LAB — CBC
HCT: 30.5 % — ABNORMAL LOW (ref 39.0–52.0)
Hemoglobin: 9.5 g/dL — ABNORMAL LOW (ref 13.0–17.0)
MCH: 29.2 pg (ref 26.0–34.0)
MCHC: 31.1 g/dL (ref 30.0–36.0)
MCV: 93.8 fL (ref 78.0–100.0)
PLATELETS: 196 10*3/uL (ref 150–400)
RBC: 3.25 MIL/uL — ABNORMAL LOW (ref 4.22–5.81)
RDW: 13.6 % (ref 11.5–15.5)
WBC: 4.9 10*3/uL (ref 4.0–10.5)

## 2015-09-18 LAB — BASIC METABOLIC PANEL
Anion gap: 6 (ref 5–15)
BUN: 24 mg/dL — ABNORMAL HIGH (ref 6–20)
CALCIUM: 8.3 mg/dL — AB (ref 8.9–10.3)
CO2: 25 mmol/L (ref 22–32)
CREATININE: 1.68 mg/dL — AB (ref 0.61–1.24)
Chloride: 106 mmol/L (ref 101–111)
GFR, EST AFRICAN AMERICAN: 43 mL/min — AB (ref >60)
GFR, EST NON AFRICAN AMERICAN: 37 mL/min — AB (ref >60)
GLUCOSE: 109 mg/dL — AB (ref 65–99)
Potassium: 4.3 mmol/L (ref 3.5–5.1)
Sodium: 137 mmol/L (ref 135–145)

## 2015-09-18 LAB — LEGIONELLA PNEUMOPHILA SEROGP 1 UR AG: L. PNEUMOPHILA SEROGP 1 UR AG: NEGATIVE

## 2015-09-18 LAB — CULTURE, BLOOD (ROUTINE X 2)

## 2015-09-18 LAB — C-REACTIVE PROTEIN: CRP: 14.3 mg/dL — ABNORMAL HIGH (ref <1.0)

## 2015-09-18 LAB — SEDIMENTATION RATE: SED RATE: 95 mm/h — AB (ref 0–16)

## 2015-09-18 LAB — URIC ACID: URIC ACID, SERUM: 8.9 mg/dL — AB (ref 4.4–7.6)

## 2015-09-18 MED ORDER — PREDNISONE 20 MG PO TABS: 40.0000 mg | ORAL_TABLET | Freq: Every day | ORAL | 0 refills | Status: AC

## 2015-09-18 MED ORDER — LEVOFLOXACIN 750 MG PO TABS: 750.0000 mg | ORAL_TABLET | ORAL | 0 refills | Status: DC

## 2015-09-18 MED ORDER — PREDNISONE 20 MG PO TABS
40.0000 mg | ORAL_TABLET | Freq: Every day | ORAL | Status: DC
  Administered 2015-09-18: 40 mg via ORAL
  Filled 2015-09-18: qty 2

## 2015-09-18 MED ORDER — LEVOFLOXACIN 500 MG PO TABS
500.0000 mg | ORAL_TABLET | ORAL | Status: DC
  Administered 2015-09-18: 500 mg via ORAL
  Filled 2015-09-18: qty 1

## 2015-09-18 MED ORDER — COLCHICINE 0.6 MG PO TABS: 0.6000 mg | ORAL_TABLET | Freq: Every day | ORAL | 0 refills | Status: DC

## 2015-09-18 MED ORDER — ACETAMINOPHEN 500 MG PO TABS
500.0000 mg | ORAL_TABLET | Freq: Four times a day (QID) | ORAL | 0 refills | Status: AC | PRN
Start: 2015-09-18 — End: ?

## 2015-09-18 MED ORDER — LEVOFLOXACIN 500 MG PO TABS: 500.0000 mg | ORAL_TABLET | ORAL | Status: DC

## 2015-09-18 MED ORDER — PREDNISONE 20 MG PO TABS: 40.0000 mg | ORAL_TABLET | Freq: Every day | ORAL | 0 refills | Status: DC

## 2015-09-18 MED ORDER — LEVOFLOXACIN 500 MG PO TABS: 500.0000 mg | ORAL_TABLET | Freq: Every day | ORAL | 0 refills | Status: AC

## 2015-09-18 MED ORDER — SENNOSIDES-DOCUSATE SODIUM 8.6-50 MG PO TABS: 1.0000 | ORAL_TABLET | Freq: Two times a day (BID) | ORAL | 0 refills | Status: DC | PRN

## 2015-09-18 MED ORDER — SENNOSIDES-DOCUSATE SODIUM 8.6-50 MG PO TABS: 1.0000 | ORAL_TABLET | Freq: Two times a day (BID) | ORAL | 0 refills | Status: AC | PRN

## 2015-09-18 MED ORDER — LEVOFLOXACIN 750 MG PO TABS: 750.0000 mg | ORAL_TABLET | ORAL | Status: DC

## 2015-09-18 MED ORDER — COLCHICINE 0.6 MG PO TABS
0.6000 mg | ORAL_TABLET | Freq: Every day | ORAL | Status: DC
  Administered 2015-09-18: 0.6 mg via ORAL
  Filled 2015-09-18: qty 1

## 2015-09-18 MED ORDER — COLCHICINE 0.6 MG PO TABS: 0.6000 mg | ORAL_TABLET | Freq: Every day | ORAL | 0 refills | Status: AC

## 2015-09-18 MED ORDER — ACETAMINOPHEN 500 MG PO TABS: 500.0000 mg | ORAL_TABLET | Freq: Four times a day (QID) | ORAL | 0 refills | Status: DC | PRN

## 2015-09-18 NOTE — Progress Notes (Signed)
Patient had 6 beat run of Vtach. On-call [facility] coverage, [place] notified.  Will continue to monitor and notify as needed.

## 2015-09-18 NOTE — Progress Notes (Signed)
Physical Therapy Treatment Patient Details Name: Jobey Cong MRN: [id number] DOB: 07/08/1934 Today's Date: 09/18/2015    History of Present Illness Patient is a 81 yo male admitted 09/15/15 with cough, weakness, AMS.  Patient with CAP, AKI, increased troponin - coming down.   PMH:  HLD, HTN, OSA, suprapubic catheter due to CA, CKD    PT Comments    Patient limited by c/o pain in L great toe. Reported that he feels good otherwise. Tolerated ambulating short distance with min guard and therex this session. Current plan remains appropriate.   Follow Up Recommendations  No PT follow up;Supervision for mobility/OOB     Equipment Recommendations  None recommended by PT    Recommendations for Other Services       Precautions / Restrictions Precautions Precautions: Fall Restrictions Weight Bearing Restrictions: No    Mobility  Bed Mobility Overal bed mobility: Modified Independent             General bed mobility comments: Increased time; min use of rail  Transfers Overall transfer level: Needs assistance Equipment used: None Transfers: Sit to/from Stand Sit to Stand: Min guard         General transfer comment: min guard for safety  Ambulation/Gait Ambulation/Gait assistance: Min guard Ambulation Distance (Feet): 25 Feet Assistive device: None Gait Pattern/deviations: Decreased stance time - left;Decreased step length - right;Antalgic Gait velocity: decreased   General Gait Details: pt with c/o pain in L great toe which he feels may be a gout "flare up"; limited distance within pt's pain tolerance; min guard for safety; no LOB   Stairs            Wheelchair Mobility    Modified Rankin (Stroke Patients Only)       Balance Overall balance assessment: Needs assistance Sitting-balance support: No upper extremity supported;Feet supported Sitting balance-Leahy Scale: Good     Standing balance support: No upper extremity supported Standing  balance-Leahy Scale: Good                      Cognition Arousal/Alertness: Awake/alert Behavior During Therapy: WFL for tasks assessed/performed;Flat affect Overall Cognitive Status: Within Functional Limits for tasks assessed                      Exercises General Exercises - Lower Extremity Long Arc Quad: AROM;Both;20 reps;Seated Hip ABduction/ADduction: AROM;Both;20 reps Straight Leg Raises: AROM;Both;20 reps Hip Flexion/Marching: AROM;Both;20 reps    General Comments        Pertinent Vitals/Pain Pain Assessment: Faces Faces Pain Scale: Hurts little more Pain Location: L great toe Pain Descriptors / Indicators: Aching;Grimacing;Guarding Pain Intervention(s): Limited activity within patient's tolerance;Monitored during session;Premedicated before session;Repositioned    Home Living                      Prior Function            PT Goals (current goals can now be found in the care plan section) Acute Rehab PT Goals Patient Stated Goal: go home PT Goal Formulation: With patient Time For Goal Achievement: 09/22/15 Potential to Achieve Goals: Good Progress towards PT goals: Progressing toward goals    Frequency  Min 3X/week    PT Plan Current plan remains appropriate    Co-evaluation             End of Session Equipment Utilized During Treatment: Gait belt Activity Tolerance: Patient limited by pain Patient left: with call bell/phone  within reach;in chair;with chair alarm set     Time: [phone] PT Time Calculation (min) (ACUTE ONLY): 22 min  Charges:  $Therapeutic Exercise: 8-22 mins                    G Codes:      Darliss Cheney 10-14-2015, 12:24 PM

## 2015-09-18 NOTE — Progress Notes (Addendum)
Pharmacy - Levaquin   Was on ceftriaxone/azith for CAP with E coli UTI/bacteremia. Pharmacy consulted to transition to [place]. Today is D#4 antibiotics.  Est CrCl ~48 ml/min  Plan: Levaquin 500 mg PO q24h - first dose tonight Recommend 14 days of treatment for bacteremia Pharmacy signing off  Renold Genta, PharmD, BCPS Clinical Pharmacist Pager: [phone] 09/18/2015 9:18 AM

## 2015-09-18 NOTE — Discharge Summary (Signed)
Physician Discharge Summary   Patient ID: Shawn Weaver MRN: [id number] DOB/AGE: 1934-12-15 81 y.o.  Admit date: 09/15/2015 Discharge date: 09/18/2015  Primary Care Physician:  [name], MD  Discharge Diagnoses:    Escherichia coli bacteremia . CAP (community acquired pneumonia) . Hyponatremia . AKI (acute kidney injury) (HCC)   UTI   Acute gout . Normocytic anemia . Fatigue . CKD (chronic kidney disease), stage III . Depression   Consults: None  Recommendations for Outpatient Follow-up:  1. Patient was placed on Levaquin to complete full course of 14 days for Escherichia coli bacteremia 2. Please repeat CBC/BMET at next visit 3. Patient will benefit from addition of allopurinol at the follow-up appointment once acute gout flare is resolved    DIET: Heart healthy diet    Allergies:   Allergies  Allergen Reactions  . Asa [Aspirin] Other (See Comments)    Upset stomach  . Codeine Rash     DISCHARGE MEDICATIONS: Current Discharge Medication List    START taking these medications   Details  acetaminophen (TYLENOL) 500 MG tablet Take 1 tablet (500 mg total) by mouth every 6 (six) hours as needed for moderate pain. Qty: 30 tablet, Refills: 0    colchicine 0.6 MG tablet Take 1 tablet (0.6 mg total) by mouth daily. Qty: 30 tablet, Refills: 0    levofloxacin (LEVAQUIN) 500 MG tablet Take 1 tablet (500 mg total) by mouth daily. X 11 more days, stop on 09/29/15 Qty: 11 tablet, Refills: 0    predniSONE (DELTASONE) 20 MG tablet Take 2 tablets (40 mg total) by mouth daily with breakfast. For gout attack Qty: 10 tablet, Refills: 0    senna-docusate (SENOKOT-S) 8.6-50 MG tablet Take 1 tablet by mouth 2 (two) times daily as needed for mild constipation or moderate constipation. Qty: 60 tablet, Refills: 0      CONTINUE these medications which have NOT CHANGED   Details  atorvastatin (LIPITOR) 20 MG tablet Take 20 mg by mouth daily.    doxazosin (CARDURA) 2 MG  tablet Take 2 mg by mouth at bedtime.    fluticasone (FLONASE) 50 MCG/ACT nasal spray Place 1 spray into both nostrils daily.    metoprolol succinate (TOPROL-XL) 25 MG 24 hr tablet Take 25 mg by mouth 2 (two) times daily.      STOP taking these medications     ibuprofen (ADVIL,MOTRIN) 800 MG tablet      cetirizine (ZYRTEC) 10 MG tablet      citalopram (CELEXA) 20 MG tablet      LORazepam (ATIVAN) 0.5 MG tablet          Brief H and P: For complete details please refer to admission H and P, but in brief Shawn Weaver is a 81 y.o. male with bladder cancer status post resection now with suprapubic catheter, hyperlipidemia, depression, hypertension Presented with weakness, fatigue cough and dyspnea. Per report of the patient's wife, he has developed confusion, generalized weakness, and fatigue over the past several months to a year with acute worsening over the past 3 days or so. She also reports that he has not been compliant with his medications. Over the past 3 days, he has been in increasingly tired and has developed a significant cough, particularly worse on the day of his presentation. He has denied chest pain or palpitations, but notes cough productive of thick clear sputum and dyspnea with minimal exertion. He denies fevers, but notes chills for the past few days. He also endorses mild frontal headache which  he describes as not unusual for him. He denies change in vision or hearing, loss of coordination, or focal numbness or weakness. He has not been on antibiotics recently and there is been no recent long distance travel or sick contacts. In ED, patient was febrile at 102.84F, otherwise vital signs were stable  EKG demonstrated a sinus rhythm with first-degree AV block and diffuse T-wave inversions.  Chest x-ray is notable for a left basilar opacity consistent with pneumonia. sodium 131, chloride 97, BUN 38, and serum creatinine 2.31, up from 1.49 on last measurement. Total bilirubin is  mildly elevated to 1.4. CBC is notable for a normocytic anemia with hemoglobin of 11.4. Blood cultures were obtained.   Hospital Course:  Community-acquired PNA : presents with new cough, fever, infiltrate on CXR  -patient was initially placed on IV Rocephin and Zithromax, subsequently transitioned to oral Levaquin based on sensitivities from the blood cultures, to complete full 14 days course for bacteremia - Urine strep antigen negative B  Ecoli bacteremia, UTI   - Blood cultures positive for EColi possibly from UTI, urine Cuture showed 10,000 colonies  - continue Levaquin for 11 more days to complete full course of 14 days for Escherichia coli bacteremia   AKI superimposed on CKD stage III likely due to worsening of chronic kidney disease or due to Sepsis.  - SCr 2.31 on admission, up from 1.49 on most recent prior (1 yr ago)  - patient was placed on gentle hydration, creatinine improved to 1.6 at the time of discharge.    Hyponatremia - Serum sodium 131 on admission in setting of apparent dehydration and sepsis - Sodium improved,  sodium 137 at discharge  Acute gout - Patient complained of left great toe pain similar to his prior gout flares. Uric acid was 8.9, CRP 14.3, ESR 95. Patient was placed on oral prednisone for 5 days and colchicine daily. No NSAIDs secondary to acute on chronic kidney disease. He will benefit from addition of allopurinol once acute gout has resolved, deferred to PCP.  Normocytic anemia : Consistent with anemia of chronic disease - Hgb 11.4 on admission with normal MCV - No active blood-loss  - Ferritin 597 with saturation ratio and iron low, consistent with anemia of chronic disease  Chronic Generalized weakness, fatigue feels improving today - PT OT evaluation recommended no PT follow-up  - B12 normal  - TSH 1.4  EKG changes slightly elevated troponin likely due to sepsis/ bacteremia  - EKG features diffuse T-wave inversions, no chest  pain or shortness of breath  - Cardiac enzymes with slightly positive troponin and flat - 2-D echo showed EF of 60-65% no wall motion abnormalities.   Depression  - Pt reports this is stable and denies SI, HI, or hallucinations  - Continue current management with Celexa    Day of Discharge BP (!) 129/57   Pulse 67   Temp 99.5 F (37.5 C)   Resp 18   Ht 6' (1.829 m)   Wt 121.6 kg (268 lb)   SpO2 98%   BMI 36.35 kg/m   Physical Exam: General: Alert and awake oriented x3 not in any acute distress. HEENT: anicteric sclera, pupils reactive to light and accommodation CVS: S1-S2 clear no murmur rubs or gallops Chest: clear to auscultation bilaterally, no wheezing rales or rhonchi Abdomen: soft nontender, nondistended, normal bowel sounds Extremities: no cyanosis, clubbing or edema noted bilaterally Neuro: Cranial nerves II-XII intact, no focal neurological deficits   The results of significant diagnostics  from this hospitalization (including imaging, microbiology, ancillary and laboratory) are listed below for reference.    LAB RESULTS: Basic Metabolic Panel:  Recent Labs Lab 09/17/15 0520 09/18/15 0445  NA 137 137  K 4.4 4.3  CL 104 106  CO2 24 25  GLUCOSE 89 109*  BUN 29* 24*  CREATININE 1.85* 1.68*  CALCIUM 8.5* 8.3*   Liver Function Tests:  Recent Labs Lab 09/15/15 2009  AST 20  ALT 12*  ALKPHOS 66  BILITOT 1.4*  PROT 8.1  ALBUMIN 3.8   No results for input(s): LIPASE, AMYLASE in the last 168 hours. No results for input(s): AMMONIA in the last 168 hours. CBC:  Recent Labs Lab 09/17/15 0520 09/18/15 0445  WBC 6.1 4.9  HGB 11.1* 9.5*  HCT 35.2* 30.5*  MCV 94.6 93.8  PLT 199 196   Cardiac Enzymes:  Recent Labs Lab 09/16/15 1441 09/16/15 2132  TROPONINI 0.07* 0.07*   BNP: Invalid input(s): POCBNP CBG: No results for input(s): GLUCAP in the last 168 hours.  Significant Diagnostic Studies:  Dg Chest 2 View  Result Date:  09/15/2015 CLINICAL DATA:  Fever and weakness.  Altered mental status EXAM: CHEST  2 VIEW COMPARISON:  06/03/2015 FINDINGS: Indistinct left basilar opacity. This area was clear on abdominal CT 06/20/2015. Lung volumes are mildly low. Chronic cardiomegaly. Stable mediastinal contours. There is no edema, effusion, or pneumothorax. IMPRESSION: Left basilar opacity concerning for pneumonia in this clinical setting. Electronically Signed   By: Monte Fantasia M.D.   On: 09/15/2015 21:01    2D ECHO: Study Conclusions  - Left ventricle: The cavity size was normal. Wall thickness was   increased in a pattern of mild LVH. Systolic function was normal.   The estimated ejection fraction was in the range of 60% to 65%.   Wall motion was normal; there were no regional wall motion   abnormalities. Doppler parameters are consistent with abnormal   left ventricular relaxation (grade 1 diastolic dysfunction). - Mitral valve: There was trivial regurgitation. - Left atrium: The atrium was mildly dilated. - Right atrium: Central venous pressure (est): 3 mm Hg. - Atrial septum: No defect or patent foramen ovale was identified. - Tricuspid valve: There was mild regurgitation. - Pulmonary arteries: Systolic pressure was moderately increased.   PA peak pressure: 53 mm Hg (S). - Pericardium, extracardiac: There was no pericardial effusion.  Impressions:  - Mild LVH with LVEF 60-65%, no focal wall motion abnormalities   with Definity contrast. Grade 1 diastolic dysfunction with normal   estimated LV filling pressure. Mild left atrial enlargement.   Trivial mitral regurgitation. Mild tricuspid regurgitation with   evidence of moderate pulmonary hypertension, PASP estimated 53   mmHg.  Disposition and Follow-up: Discharge Instructions    Diet - low sodium heart healthy    Complete by:  As directed   Increase activity slowly    Complete by:  As directed       DISPOSITION: home    DISCHARGE  FOLLOW-UP Follow-up Information    [name], MD. Schedule an appointment as soon as possible for a visit in 2 week(s).   Specialty:  Internal Medicine Contact information: [address], [address] [place] [address] [phone]            Time spent on Discharge: 54mns   Signed:   Adael Culbreath M.D. [facility] 09/18/2015, 12:25 PM Pager: [phone]

## 2015-09-20 LAB — CULTURE, BLOOD (ROUTINE X 2): CULTURE: NO GROWTH

## 2017-08-15 ENCOUNTER — Other Ambulatory Visit: Payer: Self-pay

## 2017-08-15 ENCOUNTER — Emergency Department (HOSPITAL_BASED_OUTPATIENT_CLINIC_OR_DEPARTMENT_OTHER): Payer: Medicare Other

## 2017-08-15 ENCOUNTER — Encounter (HOSPITAL_BASED_OUTPATIENT_CLINIC_OR_DEPARTMENT_OTHER): Payer: Self-pay

## 2017-08-15 ENCOUNTER — Emergency Department (HOSPITAL_BASED_OUTPATIENT_CLINIC_OR_DEPARTMENT_OTHER)
Admission: EM | Admit: 2017-08-15 | Discharge: 2017-08-15 | Disposition: A | Discharge status: Home / Self Care | Payer: Medicare Other | Attending: Emergency Medicine | Admitting: Emergency Medicine

## 2017-08-15 DIAGNOSIS — N183 Chronic kidney disease, stage 3 (moderate): Secondary | ICD-10-CM | POA: Diagnosis not present

## 2017-08-15 DIAGNOSIS — Z950 Presence of cardiac pacemaker: Secondary | ICD-10-CM | POA: Insufficient documentation

## 2017-08-15 DIAGNOSIS — J069 Acute upper respiratory infection, unspecified: Secondary | ICD-10-CM | POA: Diagnosis not present

## 2017-08-15 DIAGNOSIS — B9789 Other viral agents as the cause of diseases classified elsewhere: Secondary | ICD-10-CM | POA: Insufficient documentation

## 2017-08-15 DIAGNOSIS — Z8551 Personal history of malignant neoplasm of bladder: Secondary | ICD-10-CM | POA: Diagnosis not present

## 2017-08-15 DIAGNOSIS — R05 Cough: Secondary | ICD-10-CM | POA: Diagnosis present

## 2017-08-15 MED ORDER — BENZONATATE 100 MG PO CAPS: 100.0000 mg | ORAL_CAPSULE | Freq: Three times a day (TID) | ORAL | 0 refills | Status: AC

## 2017-08-15 MED ORDER — FLUTICASONE PROPIONATE 50 MCG/ACT NA SUSP: 2.0000 | Freq: Every day | NASAL | 0 refills | Status: AC

## 2017-08-15 MED FILL — BENZONATATE 100 MG CAPSULE: 100 | 7 days supply | Qty: 21 | Fill #0

## 2017-08-15 MED FILL — FLUTICASONE PROP 50 MCG SPR: 50 | 30 days supply | Qty: 16 | Fill #0

## 2017-08-15 NOTE — ED Notes (Signed)
Patient transported to XR. 

## 2017-08-15 NOTE — Discharge Instructions (Signed)

## 2017-08-15 NOTE — ED Triage Notes (Signed)
C/o prod cough x 2-3 weeks-seen by PCP last week-no CXR-was started on meds-NAD-steady gait

## 2017-08-15 NOTE — ED Provider Notes (Addendum)
Emergency Department Provider Note   I have reviewed the triage vital signs and the nursing notes.   HISTORY  Chief Complaint Cough   HPI Shawn Weaver is a 83 y.o. male with PMH of HLD presents to the emergency department for evaluation of productive cough.  The patient's symptoms have been ongoing for several weeks.  States he saw his primary care physician earlier this month and was started on Cefdinir and cough medicine.  Symptoms have not improved on this medication.  He states he is coughing up mucus that is nonbloody.  He denies any fevers or shaking chills.  No chest pain or heart palpitations.  He does have a pacemaker and denies any pain in the area of this device.  No sick contacts.  He does not smoke cigarettes. No radiation of symptoms or modifying factors.    Past Medical History:  Diagnosis Date  . Cancer ([place])   . Hyperlipidemia     Patient Active Problem List   Diagnosis Date Noted  . Hx of bladder cancer 09/16/2015  . Hyponatremia 09/16/2015  . AKI (acute kidney injury) ([place]) 09/16/2015  . Normocytic anemia 09/16/2015  . Generalized weakness 09/16/2015  . Fatigue 09/16/2015  . CKD (chronic kidney disease), stage III ([place]) 09/16/2015  . Depression 09/16/2015  . Community acquired pneumonia   . CAP (community acquired pneumonia) 09/15/2015    Past Surgical History:  Procedure Laterality Date  . bladder removed    . CIRCUMCISION    . COLOSTOMY  2015  . CORONARY STENT PLACEMENT    . PACEMAKER IMPLANT    . SUPRAPUBIC CATHETER PLACEMENT      Allergies Asa [aspirin] and Codeine  No family history on file.  Social History Social History   Tobacco Use  . Smoking status: Never Smoker  . Smokeless tobacco: Never Used  Substance Use Topics  . Alcohol use: No  . Drug use: No    Review of Systems  Constitutional: No fever/chills Eyes: No visual changes. ENT: No sore throat. Cardiovascular: Denies chest pain.  Respiratory: Denies shortness of  breath. Positive productive cough.  Gastrointestinal: No abdominal pain.  No nausea, no vomiting.  No diarrhea.  No constipation. Genitourinary: Negative for dysuria. Musculoskeletal: Negative for back pain. Skin: Negative for rash. Neurological: Negative for headaches, focal weakness or numbness.  10-point ROS otherwise negative.  ____________________________________________   PHYSICAL EXAM:  VITAL SIGNS: ED Triage Vitals  Enc Vitals Group     BP 08/15/17 1135 108/70     Pulse Rate 08/15/17 1135 66     Resp 08/15/17 1135 18     Temp 08/15/17 1135 98.7 F (37.1 C)     Temp Source 08/15/17 1135 Oral     SpO2 08/15/17 1135 98 %     Weight 08/15/17 1135 252 lb (114.3 kg)     Height 08/15/17 1135 6' (1.829 m)     Pain Score 08/15/17 1132 0   Constitutional: Alert and oriented. Well appearing and in no acute distress. Eyes: Conjunctivae are normal. Head: Atraumatic. Nose: No congestion/rhinnorhea. Mouth/Throat: Mucous membranes are moist.  Neck: No stridor.   Cardiovascular: Normal rate, regular rhythm. Good peripheral circulation. Grossly normal heart sounds.   Respiratory: Normal respiratory effort.  No retractions. Lungs CTAB. Gastrointestinal: Soft and nontender. No distention.  Musculoskeletal: No lower extremity tenderness nor edema. No gross deformities of extremities. Neurologic:  Normal speech and language. No gross focal neurologic deficits are appreciated.  Skin:  Skin is warm, dry and  intact. No rash noted.  ____________________________________________  RADIOLOGY  Dg Chest 2 View  Result Date: 08/15/2017 CLINICAL DATA:  Productive cough for 2-3 weeks. History of bladder cancer. EXAM: CHEST - 2 VIEW COMPARISON:  Chest x-rays dated 06/10/2017, 06/04/2017 and 11/19/2016. FINDINGS: Stable cardiomegaly. Atherosclerotic changes noted at the aortic arch. Mild bilateral interstitial prominence is stable. No new confluent opacity to suggest a developing pneumonia.  Questionable mild parabronchial cuffing. No pleural effusion or pneumothorax seen. LEFT chest wall pacemaker/ICD apparatus appears stable IMPRESSION: 1. Questionable mild peribronchial cuffing raising the possibility of acute bronchitis versus mild CHF/volume overload. 2. No evidence of consolidating pneumonia. 3. Stable cardiomegaly. 4. Aortic atherosclerosis. Electronically Signed   By: Franki Cabot M.D.   On: 08/15/2017 12:13    ____________________________________________   PROCEDURES  Procedure(s) performed:   Procedures  None  ____________________________________________   INITIAL IMPRESSION / ASSESSMENT AND PLAN / ED COURSE  Pertinent labs & imaging results that were available during my care of the patient were reviewed by me and considered in my medical decision making (see chart for details).  Patient presents to the emergency department for evaluation of productive cough worsening over the last several weeks.  He is a non-smoker.  His exam is unremarkable.  He has no tachycardia or hypoxemia.  He is not experiencing any chest pain.  He was placed on antibiotics by his primary care provider with no relief in symptoms.  He has been also taking a liquid cough medication. Plan for CXR and reassess.   CXR with peribronchial cuffing. No PNA. No signs on exam to suggest volume overload. Plan for tessalon and flonase with close PCP follow up.   At this time, I do not feel there is any life-threatening condition present. I have reviewed and discussed all results (EKG, imaging, lab, urine as appropriate), exam findings with patient. I have reviewed nursing notes and appropriate previous records.  I feel the patient is safe to be discharged home without further emergent workup. Discussed usual and customary return precautions. Patient and family (if present) verbalize understanding and are comfortable with this plan.  Patient will follow-up with their primary care provider. If they do not  have a primary care provider, information for follow-up has been provided to them. All questions have been answered.  ____________________________________________  FINAL CLINICAL IMPRESSION(S) / ED DIAGNOSES  Final diagnoses:  Viral URI with cough    NEW OUTPATIENT MEDICATIONS STARTED DURING THIS VISIT:  Discharge Medication List as of 08/15/2017 12:32 PM    START taking these medications   Details  benzonatate (TESSALON) 100 MG capsule Take 1 capsule (100 mg total) by mouth every 8 (eight) hours., Starting Fri 08/15/2017, Print        Note:  This document was prepared using Dragon voice recognition software and may include unintentional dictation errors.  Nanda Quinton, MD Emergency Medicine    Carrianne Hyun, [facility], MD 08/15/17 1931    Margette Fast, MD 08/15/17 [phone]

## 2018-01-01 IMAGING — DX DG CHEST 2V
2 series · 2 of 2 positions shown · non-contrast
Comparison: 06/03/2015

CLINICAL DATA: Fever and weakness.  Altered mental status

EXAM:
CHEST  2 VIEW

[chest pa]
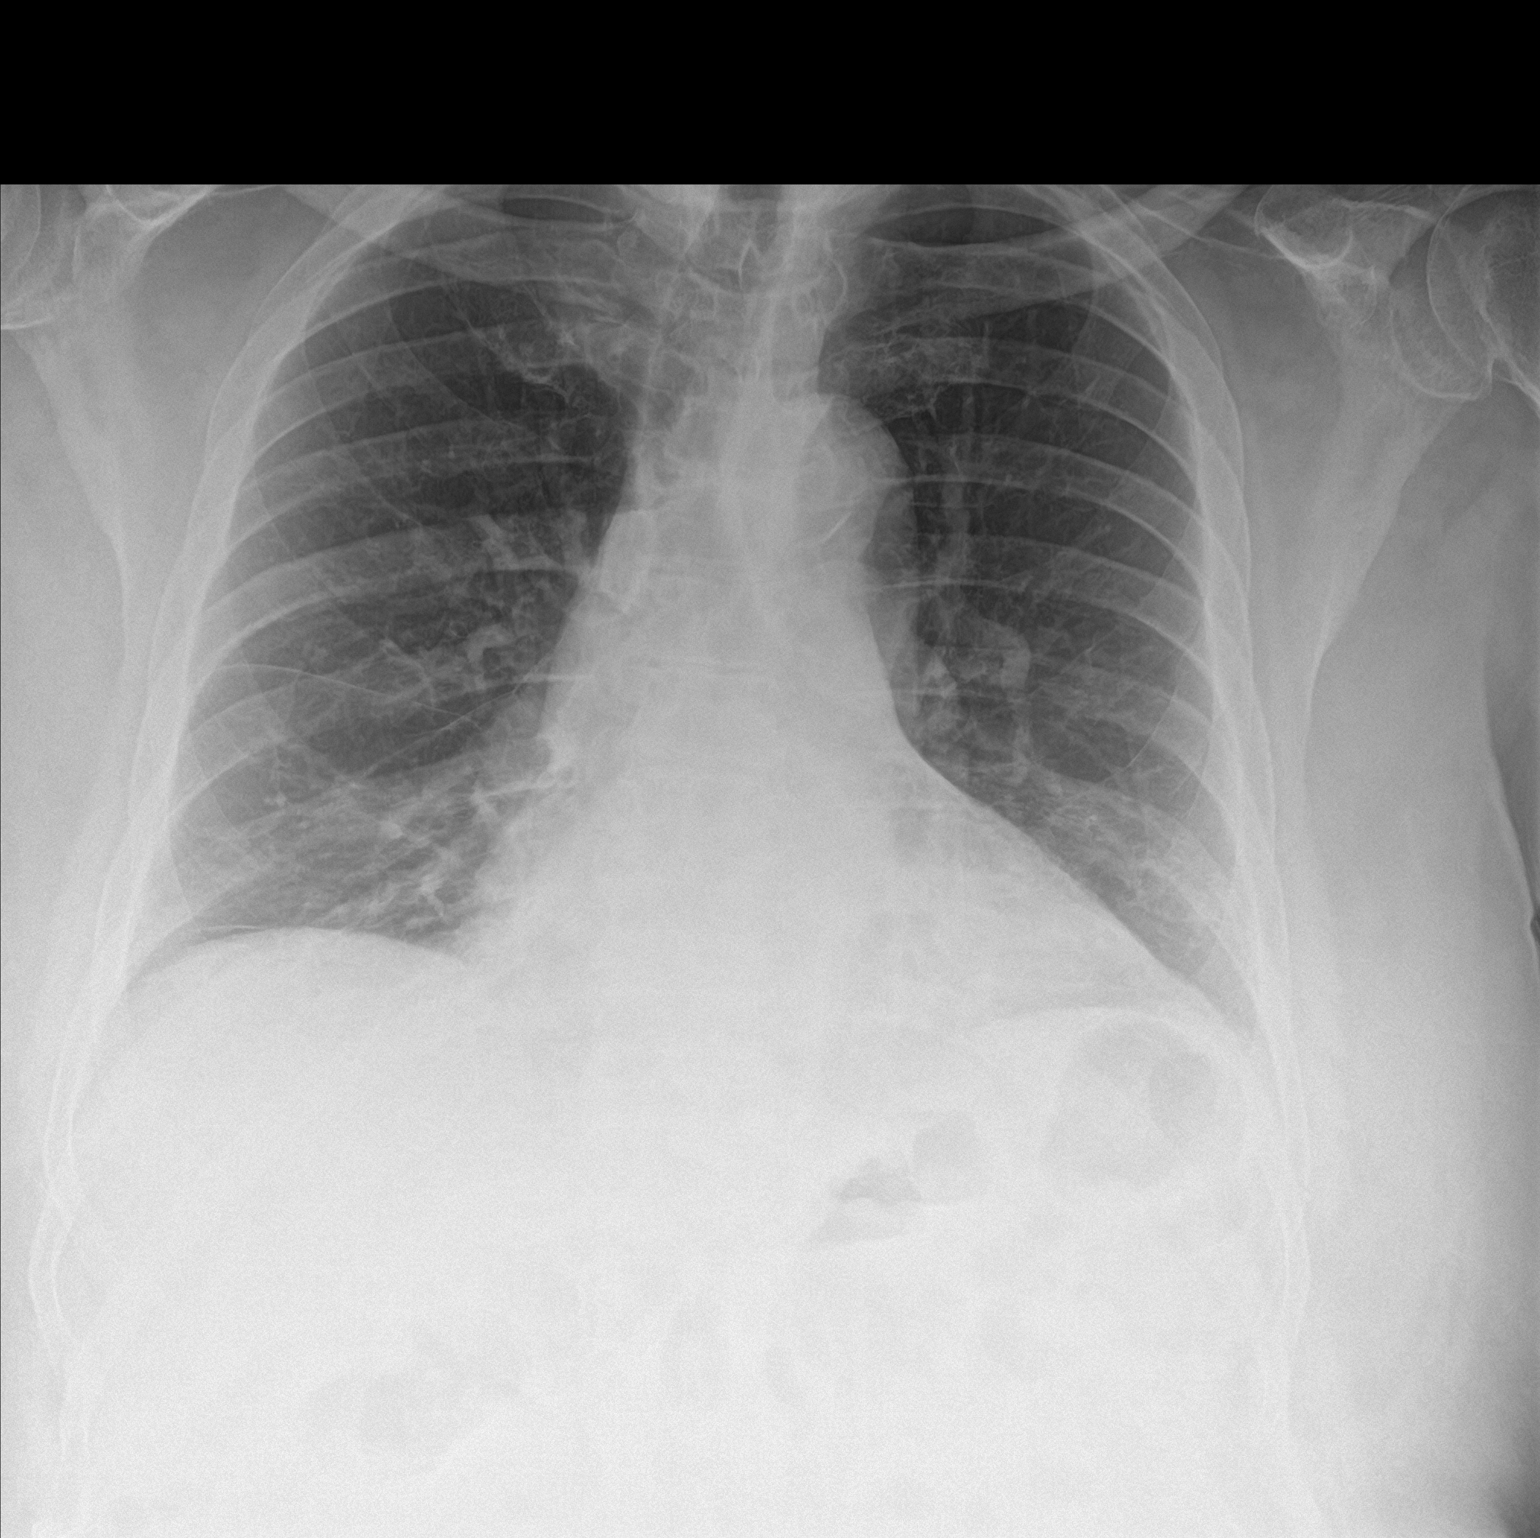

[chest lat]
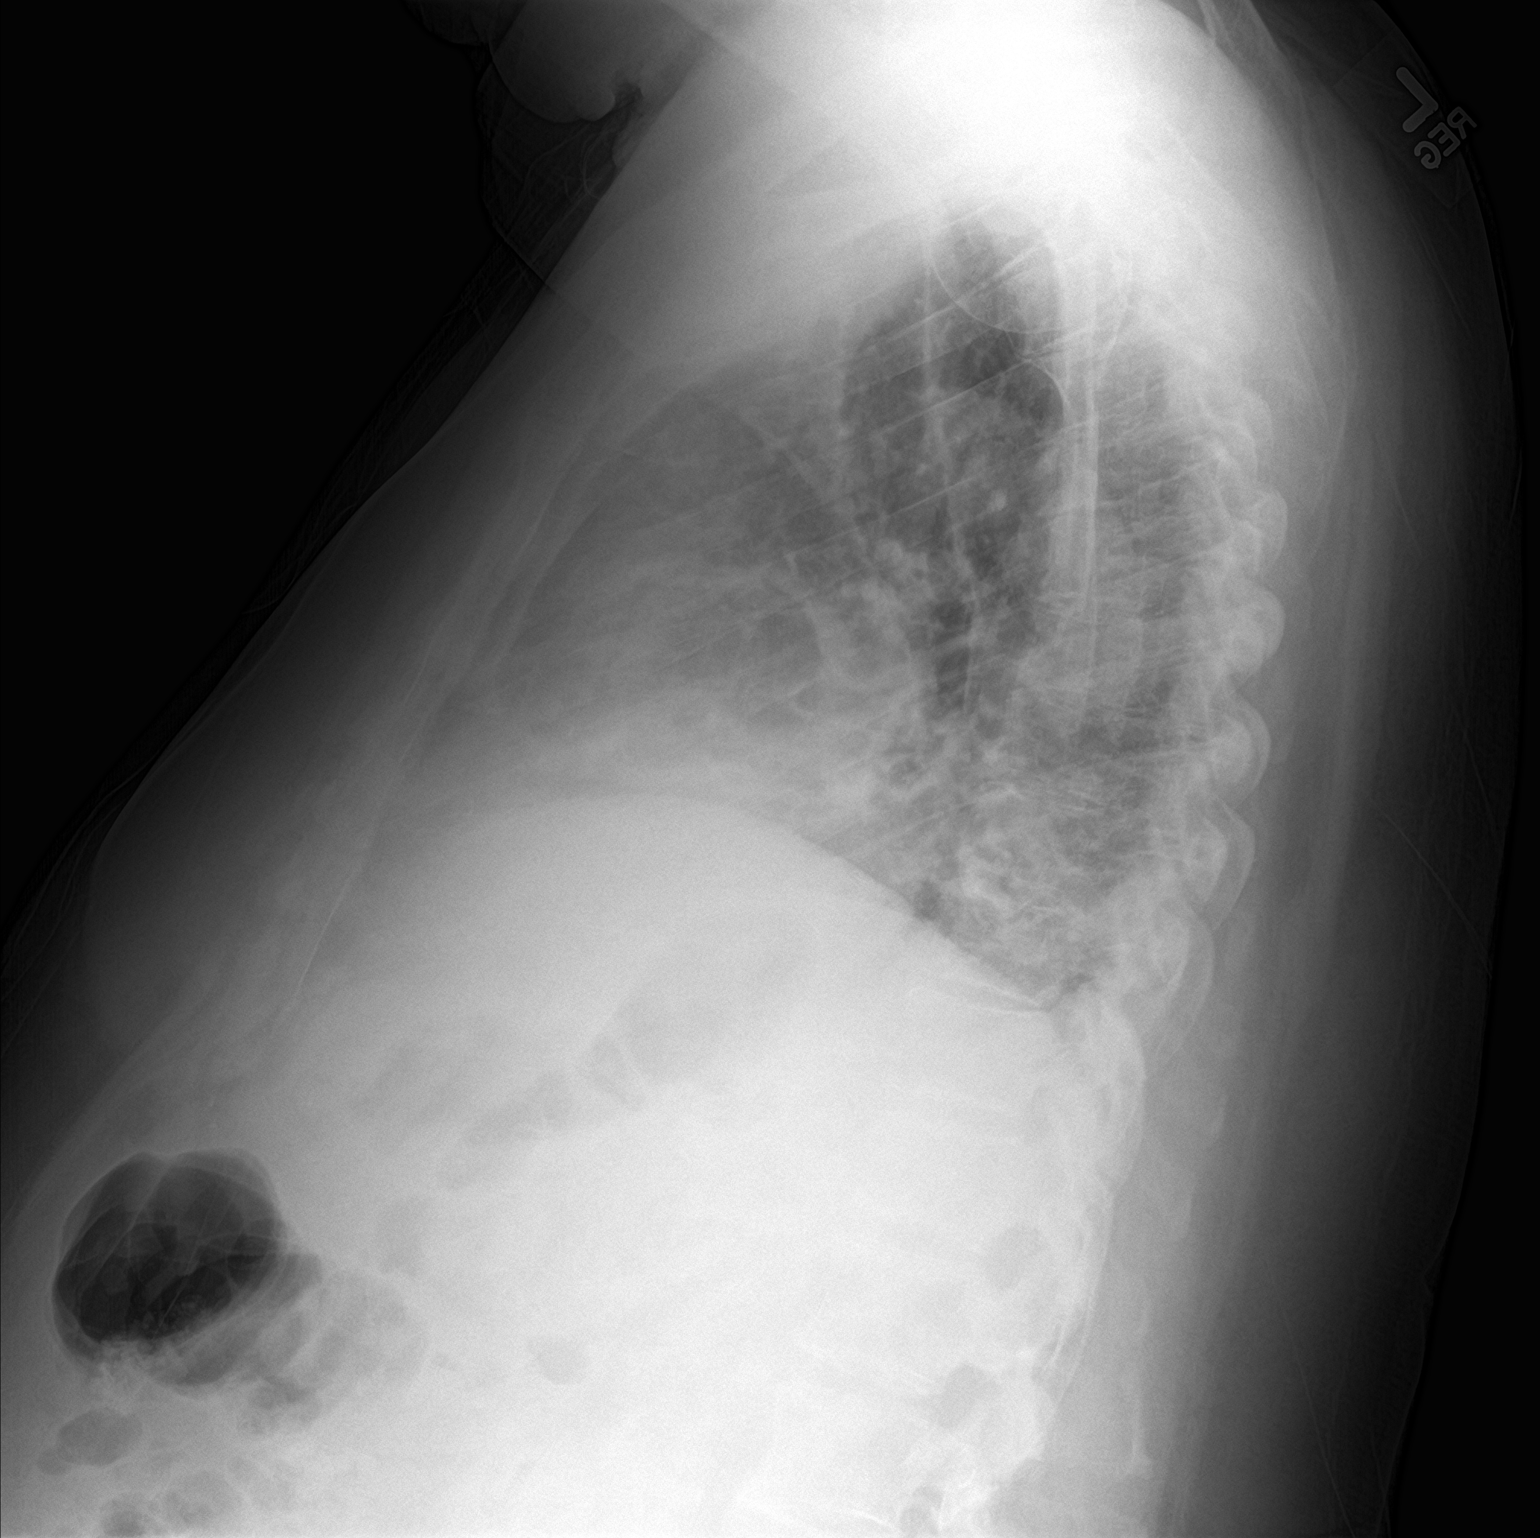

[2 of 2 positions shown; findings below may reference images not displayed]

FINDINGS: Indistinct left basilar opacity. This area was clear on abdominal CT
06/20/2015. Lung volumes are mildly low. Chronic cardiomegaly.
Stable mediastinal contours. There is no edema, effusion, or
pneumothorax.
IMPRESSION: Left basilar opacity concerning for pneumonia in this clinical
setting.

## 2019-12-02 IMAGING — DX DG CHEST 2V
2 series · 2 of 2 positions shown · non-contrast
Comparison: Chest x-rays dated 06/10/2017, 06/04/2017 and
11/19/2016.

CLINICAL DATA: Productive cough for 2-3 weeks. History of bladder
cancer.

EXAM:
CHEST - 2 VIEW

[chest pa]
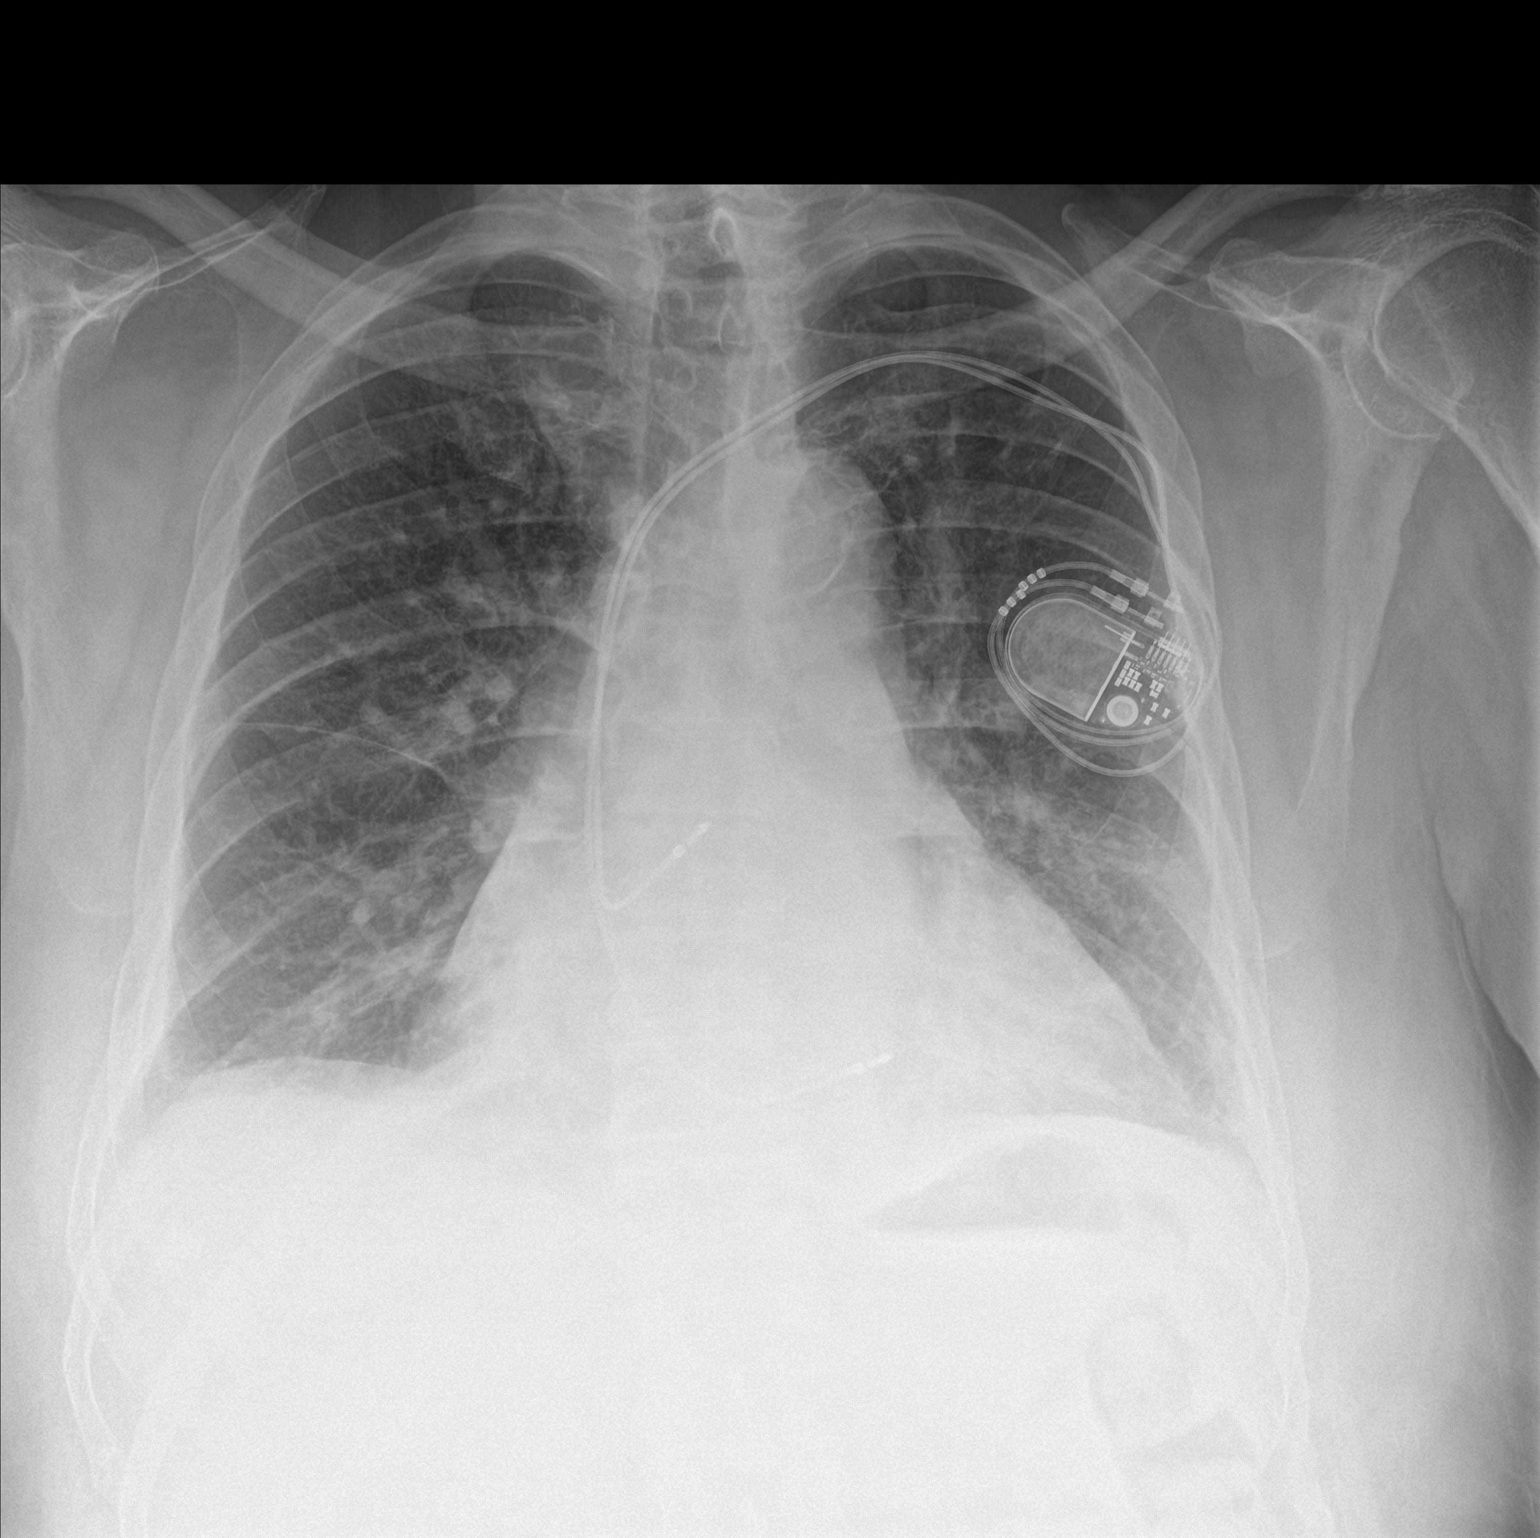

[chest lat]
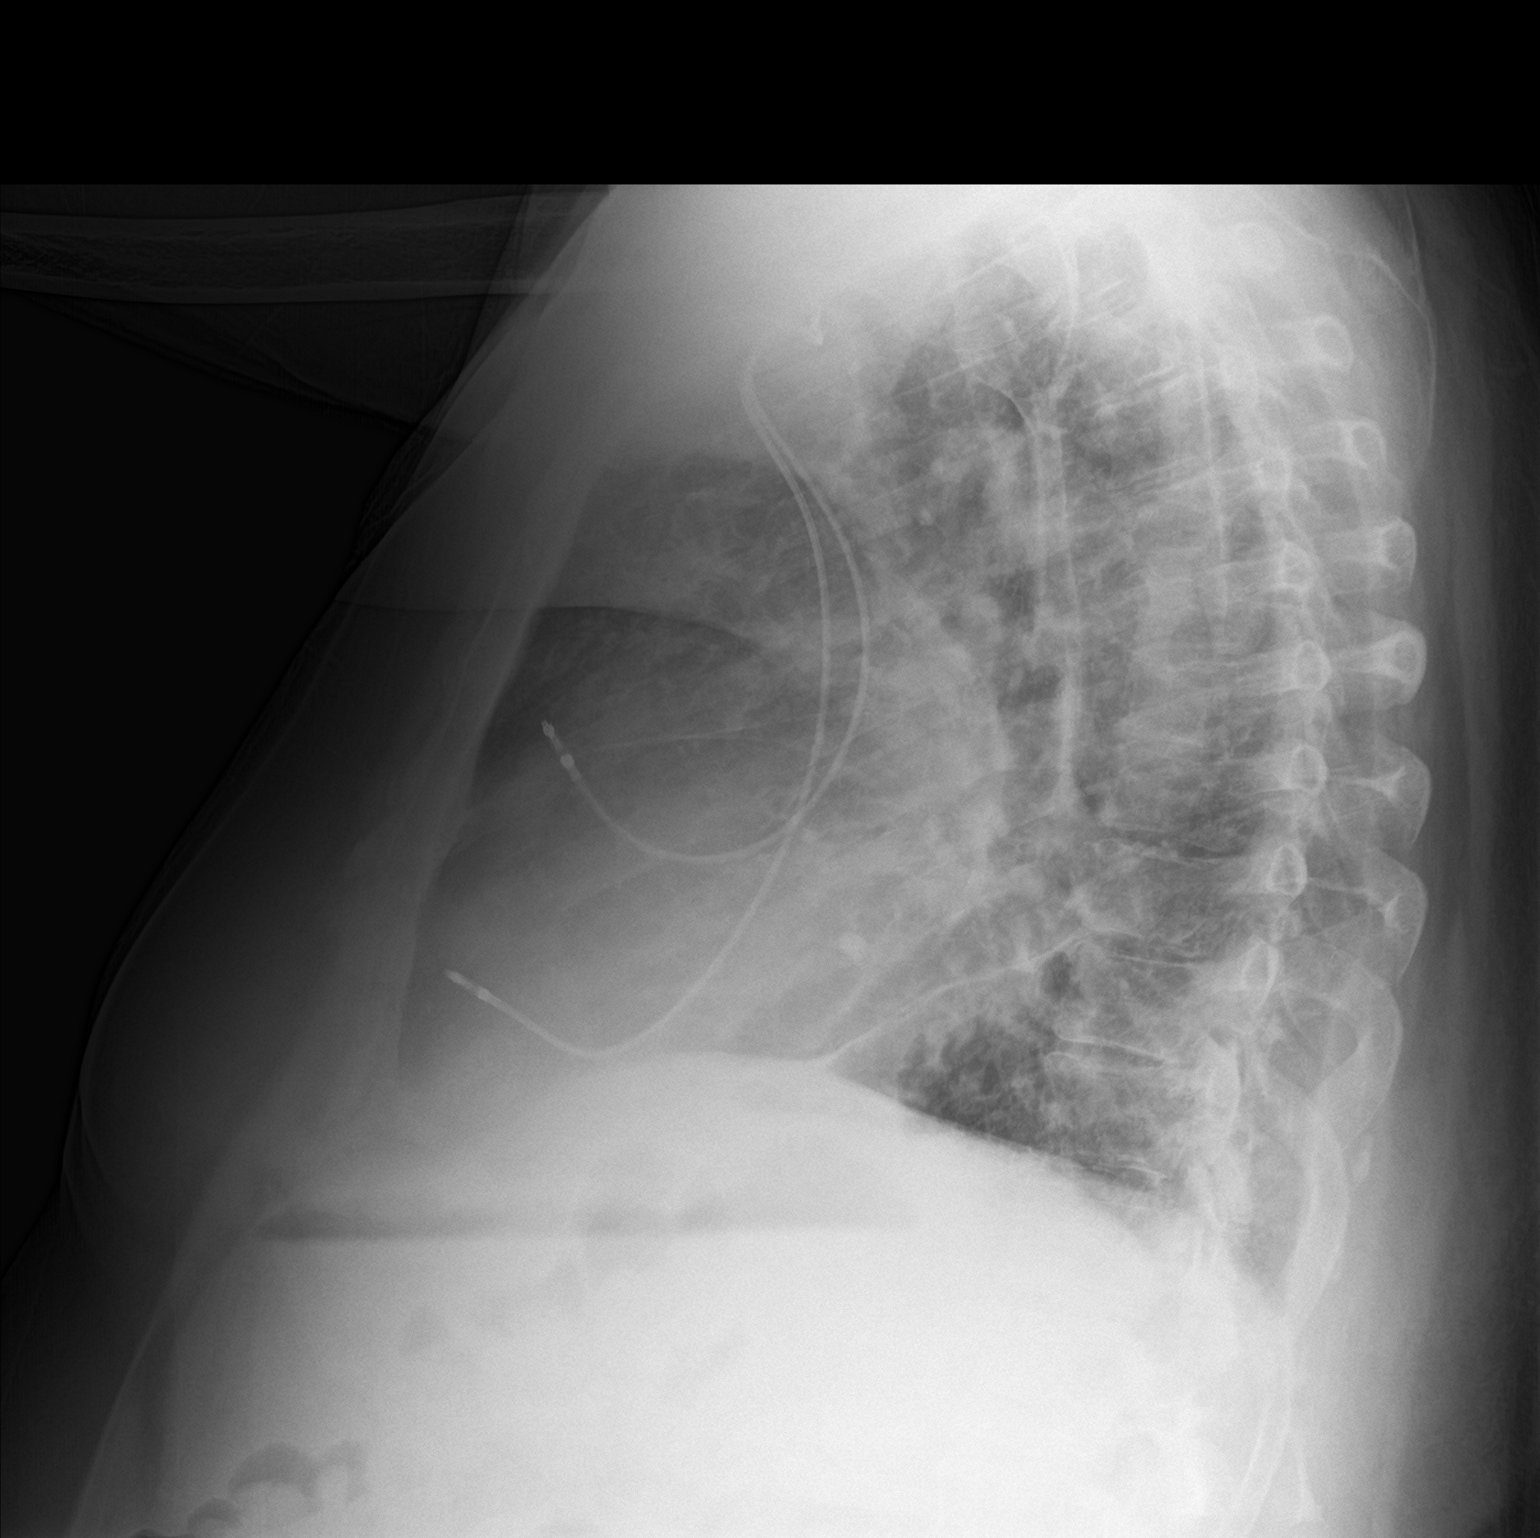

[2 of 2 positions shown; findings below may reference images not displayed]

FINDINGS: Stable cardiomegaly. Atherosclerotic changes noted at the aortic
arch. Mild bilateral interstitial prominence is stable. No new
confluent opacity to suggest a developing pneumonia. Questionable
mild parabronchial cuffing. No pleural effusion or pneumothorax
seen. LEFT chest wall pacemaker/ICD apparatus appears stable
IMPRESSION: 1. Questionable mild peribronchial cuffing raising the possibility
of acute bronchitis versus mild CHF/volume overload.
2. No evidence of consolidating pneumonia.
3. Stable cardiomegaly.
4. Aortic atherosclerosis.
